# Patient Record
Sex: Female | Born: 1940
Health system: Southern US, Community
[De-identification: ages and names within clinical notes are randomized; demographics above are authoritative.]

## PROBLEM LIST (undated history)

## (undated) DIAGNOSIS — R7881 Bacteremia: Secondary | ICD-10-CM

## (undated) DIAGNOSIS — C55 Malignant neoplasm of uterus, part unspecified: Secondary | ICD-10-CM

## (undated) DIAGNOSIS — N179 Acute kidney failure, unspecified: Secondary | ICD-10-CM

## (undated) DIAGNOSIS — C541 Malignant neoplasm of endometrium: Secondary | ICD-10-CM

## (undated) DIAGNOSIS — K5900 Constipation, unspecified: Secondary | ICD-10-CM

## (undated) DIAGNOSIS — I248 Other forms of acute ischemic heart disease: Secondary | ICD-10-CM

## (undated) DIAGNOSIS — E039 Hypothyroidism, unspecified: Secondary | ICD-10-CM

## (undated) DIAGNOSIS — A499 Bacterial infection, unspecified: Secondary | ICD-10-CM

## (undated) DIAGNOSIS — M1990 Unspecified osteoarthritis, unspecified site: Secondary | ICD-10-CM

## (undated) DIAGNOSIS — I82220 Acute embolism and thrombosis of inferior vena cava: Secondary | ICD-10-CM

## (undated) DIAGNOSIS — K649 Unspecified hemorrhoids: Secondary | ICD-10-CM

## (undated) DIAGNOSIS — Z86718 Personal history of other venous thrombosis and embolism: Secondary | ICD-10-CM

## (undated) DIAGNOSIS — I10 Essential (primary) hypertension: Secondary | ICD-10-CM

## (undated) DIAGNOSIS — I5032 Chronic diastolic (congestive) heart failure: Principal | ICD-10-CM

## (undated) DIAGNOSIS — I219 Acute myocardial infarction, unspecified: Secondary | ICD-10-CM

## (undated) HISTORY — DX: Bacteremia: R78.81

## (undated) HISTORY — DX: Malignant neoplasm of endometrium: C54.1

## (undated) HISTORY — DX: Unspecified hemorrhoids: K64.9

## (undated) HISTORY — DX: Constipation, unspecified: K59.00

## (undated) HISTORY — DX: Malignant neoplasm of uterus, part unspecified: C55

## (undated) HISTORY — DX: Hypothyroidism, unspecified: E03.9

## (undated) HISTORY — DX: Acute myocardial infarction, unspecified: I21.9

## (undated) HISTORY — DX: Bacterial infection, unspecified: A49.9

## (undated) HISTORY — PX: OTHER SURGICAL HISTORY: SHX169

## (undated) HISTORY — DX: Other forms of acute ischemic heart disease: I24.8

## (undated) HISTORY — DX: Acute embolism and thrombosis of inferior vena cava: I82.220

## (undated) HISTORY — DX: Acute kidney failure, unspecified: N17.9

## (undated) HISTORY — DX: Personal history of other venous thrombosis and embolism: Z86.718

## (undated) HISTORY — DX: Unspecified osteoarthritis, unspecified site: M19.90

## (undated) HISTORY — DX: Essential (primary) hypertension: I10

## (undated) HISTORY — DX: Chronic diastolic (congestive) heart failure: I50.32

---

## 2016-03-20 DIAGNOSIS — M1990 Unspecified osteoarthritis, unspecified site: Secondary | ICD-10-CM | POA: Diagnosis not present

## 2016-03-20 DIAGNOSIS — E039 Hypothyroidism, unspecified: Secondary | ICD-10-CM | POA: Diagnosis not present

## 2016-03-20 DIAGNOSIS — Z1231 Encounter for screening mammogram for malignant neoplasm of breast: Secondary | ICD-10-CM | POA: Diagnosis not present

## 2016-04-30 DIAGNOSIS — Z79899 Other long term (current) drug therapy: Secondary | ICD-10-CM | POA: Diagnosis not present

## 2016-04-30 DIAGNOSIS — R030 Elevated blood-pressure reading, without diagnosis of hypertension: Secondary | ICD-10-CM | POA: Diagnosis not present

## 2016-04-30 DIAGNOSIS — Z1231 Encounter for screening mammogram for malignant neoplasm of breast: Secondary | ICD-10-CM | POA: Diagnosis not present

## 2016-04-30 DIAGNOSIS — Z0000 Encounter for general adult medical examination without abnormal findings: Secondary | ICD-10-CM | POA: Diagnosis not present

## 2016-04-30 DIAGNOSIS — Z1389 Encounter for screening for other disorder: Secondary | ICD-10-CM | POA: Diagnosis not present

## 2016-04-30 DIAGNOSIS — E039 Hypothyroidism, unspecified: Secondary | ICD-10-CM | POA: Diagnosis not present

## 2016-06-27 DIAGNOSIS — Z1211 Encounter for screening for malignant neoplasm of colon: Secondary | ICD-10-CM | POA: Diagnosis not present

## 2016-06-27 DIAGNOSIS — K5900 Constipation, unspecified: Secondary | ICD-10-CM | POA: Diagnosis not present

## 2016-08-05 DIAGNOSIS — D696 Thrombocytopenia, unspecified: Secondary | ICD-10-CM | POA: Diagnosis not present

## 2016-08-05 DIAGNOSIS — R809 Proteinuria, unspecified: Secondary | ICD-10-CM | POA: Diagnosis not present

## 2016-08-05 DIAGNOSIS — E039 Hypothyroidism, unspecified: Secondary | ICD-10-CM | POA: Diagnosis not present

## 2016-12-04 DIAGNOSIS — R69 Illness, unspecified: Secondary | ICD-10-CM | POA: Diagnosis not present

## 2017-05-06 DIAGNOSIS — Z1231 Encounter for screening mammogram for malignant neoplasm of breast: Secondary | ICD-10-CM | POA: Diagnosis not present

## 2017-05-06 DIAGNOSIS — Z23 Encounter for immunization: Secondary | ICD-10-CM | POA: Diagnosis not present

## 2017-05-06 DIAGNOSIS — E039 Hypothyroidism, unspecified: Secondary | ICD-10-CM | POA: Diagnosis not present

## 2017-05-06 DIAGNOSIS — I10 Essential (primary) hypertension: Secondary | ICD-10-CM | POA: Diagnosis not present

## 2017-05-06 DIAGNOSIS — Z0001 Encounter for general adult medical examination with abnormal findings: Secondary | ICD-10-CM | POA: Diagnosis not present

## 2017-05-06 DIAGNOSIS — Z1389 Encounter for screening for other disorder: Secondary | ICD-10-CM | POA: Diagnosis not present

## 2017-05-06 DIAGNOSIS — M810 Age-related osteoporosis without current pathological fracture: Secondary | ICD-10-CM | POA: Diagnosis not present

## 2017-05-08 DIAGNOSIS — Z1211 Encounter for screening for malignant neoplasm of colon: Secondary | ICD-10-CM | POA: Diagnosis not present

## 2017-06-05 DIAGNOSIS — R69 Illness, unspecified: Secondary | ICD-10-CM | POA: Diagnosis not present

## 2017-06-06 DIAGNOSIS — Z1231 Encounter for screening mammogram for malignant neoplasm of breast: Secondary | ICD-10-CM | POA: Diagnosis not present

## 2017-06-06 DIAGNOSIS — M8588 Other specified disorders of bone density and structure, other site: Secondary | ICD-10-CM | POA: Diagnosis not present

## 2017-06-06 DIAGNOSIS — M810 Age-related osteoporosis without current pathological fracture: Secondary | ICD-10-CM | POA: Diagnosis not present

## 2017-06-17 DIAGNOSIS — I10 Essential (primary) hypertension: Secondary | ICD-10-CM | POA: Diagnosis not present

## 2017-06-17 DIAGNOSIS — M810 Age-related osteoporosis without current pathological fracture: Secondary | ICD-10-CM | POA: Diagnosis not present

## 2017-06-17 DIAGNOSIS — E785 Hyperlipidemia, unspecified: Secondary | ICD-10-CM | POA: Diagnosis not present

## 2017-08-28 DIAGNOSIS — I10 Essential (primary) hypertension: Secondary | ICD-10-CM | POA: Diagnosis not present

## 2017-09-25 DIAGNOSIS — I10 Essential (primary) hypertension: Secondary | ICD-10-CM | POA: Diagnosis not present

## 2017-11-27 DIAGNOSIS — I10 Essential (primary) hypertension: Secondary | ICD-10-CM | POA: Diagnosis not present

## 2017-11-27 DIAGNOSIS — E039 Hypothyroidism, unspecified: Secondary | ICD-10-CM | POA: Diagnosis not present

## 2017-11-27 DIAGNOSIS — Z79899 Other long term (current) drug therapy: Secondary | ICD-10-CM | POA: Diagnosis not present

## 2017-11-27 DIAGNOSIS — E785 Hyperlipidemia, unspecified: Secondary | ICD-10-CM | POA: Diagnosis not present

## 2017-11-27 DIAGNOSIS — D696 Thrombocytopenia, unspecified: Secondary | ICD-10-CM | POA: Diagnosis not present

## 2017-12-10 DIAGNOSIS — R69 Illness, unspecified: Secondary | ICD-10-CM | POA: Diagnosis not present

## 2018-03-09 DIAGNOSIS — N200 Calculus of kidney: Secondary | ICD-10-CM | POA: Diagnosis not present

## 2018-03-10 DIAGNOSIS — R110 Nausea: Secondary | ICD-10-CM | POA: Diagnosis not present

## 2018-03-10 DIAGNOSIS — R1030 Lower abdominal pain, unspecified: Secondary | ICD-10-CM | POA: Diagnosis not present

## 2018-03-10 DIAGNOSIS — M25551 Pain in right hip: Secondary | ICD-10-CM | POA: Diagnosis not present

## 2018-03-11 ENCOUNTER — Encounter: Payer: Self-pay | Admitting: Cardiology

## 2018-03-11 DIAGNOSIS — N858 Other specified noninflammatory disorders of uterus: Secondary | ICD-10-CM | POA: Diagnosis not present

## 2018-03-11 DIAGNOSIS — J9601 Acute respiratory failure with hypoxia: Secondary | ICD-10-CM | POA: Diagnosis not present

## 2018-03-11 DIAGNOSIS — N390 Urinary tract infection, site not specified: Secondary | ICD-10-CM | POA: Diagnosis not present

## 2018-03-11 DIAGNOSIS — I5031 Acute diastolic (congestive) heart failure: Secondary | ICD-10-CM | POA: Diagnosis not present

## 2018-03-11 DIAGNOSIS — N950 Postmenopausal bleeding: Secondary | ICD-10-CM | POA: Diagnosis not present

## 2018-03-11 DIAGNOSIS — M4646 Discitis, unspecified, lumbar region: Secondary | ICD-10-CM | POA: Diagnosis not present

## 2018-03-11 DIAGNOSIS — R1084 Generalized abdominal pain: Secondary | ICD-10-CM | POA: Diagnosis not present

## 2018-03-11 DIAGNOSIS — M4807 Spinal stenosis, lumbosacral region: Secondary | ICD-10-CM | POA: Diagnosis not present

## 2018-03-11 DIAGNOSIS — I214 Non-ST elevation (NSTEMI) myocardial infarction: Secondary | ICD-10-CM | POA: Diagnosis not present

## 2018-03-11 DIAGNOSIS — N9489 Other specified conditions associated with female genital organs and menstrual cycle: Secondary | ICD-10-CM | POA: Diagnosis not present

## 2018-03-11 DIAGNOSIS — C541 Malignant neoplasm of endometrium: Secondary | ICD-10-CM | POA: Diagnosis not present

## 2018-03-11 DIAGNOSIS — R7881 Bacteremia: Secondary | ICD-10-CM | POA: Diagnosis not present

## 2018-03-11 DIAGNOSIS — I82220 Acute embolism and thrombosis of inferior vena cava: Secondary | ICD-10-CM | POA: Diagnosis not present

## 2018-03-11 DIAGNOSIS — R0902 Hypoxemia: Secondary | ICD-10-CM | POA: Diagnosis not present

## 2018-03-11 DIAGNOSIS — E039 Hypothyroidism, unspecified: Secondary | ICD-10-CM | POA: Diagnosis not present

## 2018-03-11 DIAGNOSIS — I10 Essential (primary) hypertension: Secondary | ICD-10-CM | POA: Diagnosis not present

## 2018-03-11 DIAGNOSIS — R109 Unspecified abdominal pain: Secondary | ICD-10-CM | POA: Diagnosis not present

## 2018-03-11 DIAGNOSIS — E871 Hypo-osmolality and hyponatremia: Secondary | ICD-10-CM | POA: Diagnosis not present

## 2018-03-11 DIAGNOSIS — M7989 Other specified soft tissue disorders: Secondary | ICD-10-CM | POA: Diagnosis not present

## 2018-03-11 DIAGNOSIS — R6510 Systemic inflammatory response syndrome (SIRS) of non-infectious origin without acute organ dysfunction: Secondary | ICD-10-CM | POA: Diagnosis not present

## 2018-03-11 DIAGNOSIS — Z01810 Encounter for preprocedural cardiovascular examination: Secondary | ICD-10-CM | POA: Diagnosis not present

## 2018-03-11 DIAGNOSIS — J9811 Atelectasis: Secondary | ICD-10-CM | POA: Diagnosis not present

## 2018-03-11 DIAGNOSIS — I110 Hypertensive heart disease with heart failure: Secondary | ICD-10-CM | POA: Diagnosis not present

## 2018-03-11 DIAGNOSIS — B9620 Unspecified Escherichia coli [E. coli] as the cause of diseases classified elsewhere: Secondary | ICD-10-CM | POA: Diagnosis not present

## 2018-03-11 DIAGNOSIS — N859 Noninflammatory disorder of uterus, unspecified: Secondary | ICD-10-CM | POA: Diagnosis not present

## 2018-03-11 DIAGNOSIS — A4102 Sepsis due to Methicillin resistant Staphylococcus aureus: Secondary | ICD-10-CM | POA: Diagnosis not present

## 2018-03-11 DIAGNOSIS — I509 Heart failure, unspecified: Secondary | ICD-10-CM | POA: Diagnosis not present

## 2018-03-12 DIAGNOSIS — I509 Heart failure, unspecified: Secondary | ICD-10-CM

## 2018-03-12 DIAGNOSIS — I82220 Acute embolism and thrombosis of inferior vena cava: Secondary | ICD-10-CM

## 2018-03-16 DIAGNOSIS — Z01810 Encounter for preprocedural cardiovascular examination: Secondary | ICD-10-CM

## 2018-03-17 DIAGNOSIS — E039 Hypothyroidism, unspecified: Secondary | ICD-10-CM

## 2018-03-17 DIAGNOSIS — R6510 Systemic inflammatory response syndrome (SIRS) of non-infectious origin without acute organ dysfunction: Secondary | ICD-10-CM

## 2018-03-17 DIAGNOSIS — M4646 Discitis, unspecified, lumbar region: Secondary | ICD-10-CM

## 2018-03-18 DIAGNOSIS — I214 Non-ST elevation (NSTEMI) myocardial infarction: Secondary | ICD-10-CM

## 2018-03-18 DIAGNOSIS — N859 Noninflammatory disorder of uterus, unspecified: Secondary | ICD-10-CM

## 2018-03-19 HISTORY — PX: ABDOMINAL HYSTERECTOMY: SHX81

## 2018-03-21 DIAGNOSIS — R279 Unspecified lack of coordination: Secondary | ICD-10-CM | POA: Diagnosis not present

## 2018-03-21 DIAGNOSIS — I82220 Acute embolism and thrombosis of inferior vena cava: Secondary | ICD-10-CM | POA: Diagnosis not present

## 2018-03-21 DIAGNOSIS — G8918 Other acute postprocedural pain: Secondary | ICD-10-CM | POA: Diagnosis not present

## 2018-03-21 DIAGNOSIS — I959 Hypotension, unspecified: Secondary | ICD-10-CM | POA: Diagnosis not present

## 2018-03-21 DIAGNOSIS — D62 Acute posthemorrhagic anemia: Secondary | ICD-10-CM | POA: Diagnosis not present

## 2018-03-21 DIAGNOSIS — R079 Chest pain, unspecified: Secondary | ICD-10-CM | POA: Diagnosis not present

## 2018-03-21 DIAGNOSIS — G062 Extradural and subdural abscess, unspecified: Secondary | ICD-10-CM | POA: Diagnosis not present

## 2018-03-21 DIAGNOSIS — M869 Osteomyelitis, unspecified: Secondary | ICD-10-CM | POA: Diagnosis not present

## 2018-03-21 DIAGNOSIS — M4646 Discitis, unspecified, lumbar region: Secondary | ICD-10-CM | POA: Diagnosis not present

## 2018-03-21 DIAGNOSIS — I5032 Chronic diastolic (congestive) heart failure: Secondary | ICD-10-CM | POA: Diagnosis not present

## 2018-03-21 DIAGNOSIS — N859 Noninflammatory disorder of uterus, unspecified: Secondary | ICD-10-CM | POA: Diagnosis not present

## 2018-03-21 DIAGNOSIS — I5030 Unspecified diastolic (congestive) heart failure: Secondary | ICD-10-CM | POA: Diagnosis not present

## 2018-03-21 DIAGNOSIS — E039 Hypothyroidism, unspecified: Secondary | ICD-10-CM | POA: Diagnosis not present

## 2018-03-21 DIAGNOSIS — B9620 Unspecified Escherichia coli [E. coli] as the cause of diseases classified elsewhere: Secondary | ICD-10-CM | POA: Diagnosis not present

## 2018-03-21 DIAGNOSIS — Z22322 Carrier or suspected carrier of Methicillin resistant Staphylococcus aureus: Secondary | ICD-10-CM | POA: Diagnosis not present

## 2018-03-21 DIAGNOSIS — G629 Polyneuropathy, unspecified: Secondary | ICD-10-CM | POA: Diagnosis not present

## 2018-03-21 DIAGNOSIS — I10 Essential (primary) hypertension: Secondary | ICD-10-CM | POA: Diagnosis not present

## 2018-03-21 DIAGNOSIS — N939 Abnormal uterine and vaginal bleeding, unspecified: Secondary | ICD-10-CM | POA: Diagnosis not present

## 2018-03-21 DIAGNOSIS — Z66 Do not resuscitate: Secondary | ICD-10-CM | POA: Diagnosis not present

## 2018-03-21 DIAGNOSIS — E878 Other disorders of electrolyte and fluid balance, not elsewhere classified: Secondary | ICD-10-CM | POA: Diagnosis not present

## 2018-03-21 DIAGNOSIS — I248 Other forms of acute ischemic heart disease: Secondary | ICD-10-CM | POA: Diagnosis not present

## 2018-03-21 DIAGNOSIS — R339 Retention of urine, unspecified: Secondary | ICD-10-CM | POA: Diagnosis not present

## 2018-03-21 DIAGNOSIS — M4626 Osteomyelitis of vertebra, lumbar region: Secondary | ICD-10-CM | POA: Diagnosis not present

## 2018-03-21 DIAGNOSIS — R9431 Abnormal electrocardiogram [ECG] [EKG]: Secondary | ICD-10-CM | POA: Diagnosis not present

## 2018-03-21 DIAGNOSIS — Z01818 Encounter for other preprocedural examination: Secondary | ICD-10-CM | POA: Diagnosis not present

## 2018-03-21 DIAGNOSIS — Z7901 Long term (current) use of anticoagulants: Secondary | ICD-10-CM | POA: Diagnosis not present

## 2018-03-21 DIAGNOSIS — G061 Intraspinal abscess and granuloma: Secondary | ICD-10-CM | POA: Diagnosis not present

## 2018-03-21 DIAGNOSIS — R7989 Other specified abnormal findings of blood chemistry: Secondary | ICD-10-CM | POA: Diagnosis not present

## 2018-03-21 DIAGNOSIS — I509 Heart failure, unspecified: Secondary | ICD-10-CM | POA: Diagnosis not present

## 2018-03-21 DIAGNOSIS — I214 Non-ST elevation (NSTEMI) myocardial infarction: Secondary | ICD-10-CM | POA: Diagnosis not present

## 2018-03-21 DIAGNOSIS — B9562 Methicillin resistant Staphylococcus aureus infection as the cause of diseases classified elsewhere: Secondary | ICD-10-CM | POA: Diagnosis not present

## 2018-03-21 DIAGNOSIS — B9629 Other Escherichia coli [E. coli] as the cause of diseases classified elsewhere: Secondary | ICD-10-CM | POA: Diagnosis not present

## 2018-03-21 DIAGNOSIS — M4640 Discitis, unspecified, site unspecified: Secondary | ICD-10-CM | POA: Diagnosis not present

## 2018-03-21 DIAGNOSIS — N390 Urinary tract infection, site not specified: Secondary | ICD-10-CM | POA: Diagnosis not present

## 2018-03-21 DIAGNOSIS — Z743 Need for continuous supervision: Secondary | ICD-10-CM | POA: Diagnosis not present

## 2018-03-21 DIAGNOSIS — I21A1 Myocardial infarction type 2: Secondary | ICD-10-CM | POA: Diagnosis not present

## 2018-03-21 DIAGNOSIS — R7881 Bacteremia: Secondary | ICD-10-CM | POA: Diagnosis not present

## 2018-03-21 DIAGNOSIS — C541 Malignant neoplasm of endometrium: Secondary | ICD-10-CM | POA: Diagnosis not present

## 2018-03-22 DIAGNOSIS — C541 Malignant neoplasm of endometrium: Secondary | ICD-10-CM

## 2018-03-22 DIAGNOSIS — B9562 Methicillin resistant Staphylococcus aureus infection as the cause of diseases classified elsewhere: Secondary | ICD-10-CM | POA: Insufficient documentation

## 2018-03-22 DIAGNOSIS — R7881 Bacteremia: Secondary | ICD-10-CM | POA: Insufficient documentation

## 2018-03-22 DIAGNOSIS — I248 Other forms of acute ischemic heart disease: Secondary | ICD-10-CM

## 2018-03-22 DIAGNOSIS — I82220 Acute embolism and thrombosis of inferior vena cava: Secondary | ICD-10-CM

## 2018-03-22 DIAGNOSIS — I2489 Other forms of acute ischemic heart disease: Secondary | ICD-10-CM

## 2018-03-22 HISTORY — DX: Malignant neoplasm of endometrium: C54.1

## 2018-03-22 HISTORY — DX: Acute embolism and thrombosis of inferior vena cava: I82.220

## 2018-03-22 HISTORY — DX: Other forms of acute ischemic heart disease: I24.8

## 2018-03-22 HISTORY — DX: Other forms of acute ischemic heart disease: I24.89

## 2018-03-22 HISTORY — DX: Methicillin resistant Staphylococcus aureus infection as the cause of diseases classified elsewhere: B95.62

## 2018-03-22 HISTORY — DX: Bacteremia: R78.81

## 2018-03-25 DIAGNOSIS — I5032 Chronic diastolic (congestive) heart failure: Secondary | ICD-10-CM | POA: Insufficient documentation

## 2018-03-25 HISTORY — DX: Chronic diastolic (congestive) heart failure: I50.32

## 2018-04-08 DIAGNOSIS — E559 Vitamin D deficiency, unspecified: Secondary | ICD-10-CM | POA: Diagnosis not present

## 2018-04-08 DIAGNOSIS — Z90710 Acquired absence of both cervix and uterus: Secondary | ICD-10-CM | POA: Diagnosis not present

## 2018-04-08 DIAGNOSIS — I10 Essential (primary) hypertension: Secondary | ICD-10-CM | POA: Diagnosis not present

## 2018-04-08 DIAGNOSIS — R918 Other nonspecific abnormal finding of lung field: Secondary | ICD-10-CM | POA: Diagnosis not present

## 2018-04-08 DIAGNOSIS — I959 Hypotension, unspecified: Secondary | ICD-10-CM | POA: Diagnosis not present

## 2018-04-08 DIAGNOSIS — L89153 Pressure ulcer of sacral region, stage 3: Secondary | ICD-10-CM | POA: Diagnosis not present

## 2018-04-08 DIAGNOSIS — D62 Acute posthemorrhagic anemia: Secondary | ICD-10-CM | POA: Diagnosis not present

## 2018-04-08 DIAGNOSIS — Z483 Aftercare following surgery for neoplasm: Secondary | ICD-10-CM | POA: Diagnosis not present

## 2018-04-08 DIAGNOSIS — M4640 Discitis, unspecified, site unspecified: Secondary | ICD-10-CM | POA: Diagnosis not present

## 2018-04-08 DIAGNOSIS — E039 Hypothyroidism, unspecified: Secondary | ICD-10-CM | POA: Diagnosis not present

## 2018-04-08 DIAGNOSIS — R7881 Bacteremia: Secondary | ICD-10-CM | POA: Diagnosis not present

## 2018-04-08 DIAGNOSIS — E119 Type 2 diabetes mellitus without complications: Secondary | ICD-10-CM | POA: Diagnosis not present

## 2018-04-08 DIAGNOSIS — E785 Hyperlipidemia, unspecified: Secondary | ICD-10-CM | POA: Diagnosis not present

## 2018-04-08 DIAGNOSIS — E860 Dehydration: Secondary | ICD-10-CM | POA: Diagnosis not present

## 2018-04-08 DIAGNOSIS — L270 Generalized skin eruption due to drugs and medicaments taken internally: Secondary | ICD-10-CM | POA: Diagnosis not present

## 2018-04-08 DIAGNOSIS — L89159 Pressure ulcer of sacral region, unspecified stage: Secondary | ICD-10-CM | POA: Diagnosis not present

## 2018-04-08 DIAGNOSIS — G629 Polyneuropathy, unspecified: Secondary | ICD-10-CM | POA: Diagnosis not present

## 2018-04-08 DIAGNOSIS — A4902 Methicillin resistant Staphylococcus aureus infection, unspecified site: Secondary | ICD-10-CM | POA: Diagnosis not present

## 2018-04-08 DIAGNOSIS — Z515 Encounter for palliative care: Secondary | ICD-10-CM | POA: Diagnosis not present

## 2018-04-08 DIAGNOSIS — M4626 Osteomyelitis of vertebra, lumbar region: Secondary | ICD-10-CM | POA: Diagnosis not present

## 2018-04-08 DIAGNOSIS — R339 Retention of urine, unspecified: Secondary | ICD-10-CM | POA: Diagnosis not present

## 2018-04-08 DIAGNOSIS — Z452 Encounter for adjustment and management of vascular access device: Secondary | ICD-10-CM | POA: Diagnosis not present

## 2018-04-08 DIAGNOSIS — A4101 Sepsis due to Methicillin susceptible Staphylococcus aureus: Secondary | ICD-10-CM | POA: Diagnosis not present

## 2018-04-08 DIAGNOSIS — I509 Heart failure, unspecified: Secondary | ICD-10-CM | POA: Diagnosis not present

## 2018-04-08 DIAGNOSIS — D649 Anemia, unspecified: Secondary | ICD-10-CM | POA: Diagnosis not present

## 2018-04-08 DIAGNOSIS — Z90722 Acquired absence of ovaries, bilateral: Secondary | ICD-10-CM | POA: Diagnosis not present

## 2018-04-08 DIAGNOSIS — M4646 Discitis, unspecified, lumbar region: Secondary | ICD-10-CM | POA: Diagnosis not present

## 2018-04-08 DIAGNOSIS — C541 Malignant neoplasm of endometrium: Secondary | ICD-10-CM | POA: Diagnosis not present

## 2018-04-08 DIAGNOSIS — K449 Diaphragmatic hernia without obstruction or gangrene: Secondary | ICD-10-CM | POA: Diagnosis not present

## 2018-04-08 DIAGNOSIS — N179 Acute kidney failure, unspecified: Secondary | ICD-10-CM | POA: Diagnosis not present

## 2018-04-08 DIAGNOSIS — I110 Hypertensive heart disease with heart failure: Secondary | ICD-10-CM | POA: Diagnosis not present

## 2018-04-08 DIAGNOSIS — Z743 Need for continuous supervision: Secondary | ICD-10-CM | POA: Diagnosis not present

## 2018-04-08 DIAGNOSIS — Z79899 Other long term (current) drug therapy: Secondary | ICD-10-CM | POA: Diagnosis not present

## 2018-04-08 DIAGNOSIS — I5032 Chronic diastolic (congestive) heart failure: Secondary | ICD-10-CM | POA: Diagnosis not present

## 2018-04-08 DIAGNOSIS — I82220 Acute embolism and thrombosis of inferior vena cava: Secondary | ICD-10-CM | POA: Diagnosis not present

## 2018-04-08 DIAGNOSIS — R109 Unspecified abdominal pain: Secondary | ICD-10-CM | POA: Diagnosis not present

## 2018-04-08 DIAGNOSIS — M869 Osteomyelitis, unspecified: Secondary | ICD-10-CM | POA: Diagnosis not present

## 2018-04-08 DIAGNOSIS — Z7901 Long term (current) use of anticoagulants: Secondary | ICD-10-CM | POA: Diagnosis not present

## 2018-04-08 DIAGNOSIS — Z960 Presence of urogenital implants: Secondary | ICD-10-CM | POA: Diagnosis not present

## 2018-04-08 DIAGNOSIS — G062 Extradural and subdural abscess, unspecified: Secondary | ICD-10-CM | POA: Diagnosis not present

## 2018-04-08 DIAGNOSIS — R279 Unspecified lack of coordination: Secondary | ICD-10-CM | POA: Diagnosis not present

## 2018-04-09 DIAGNOSIS — R7881 Bacteremia: Secondary | ICD-10-CM | POA: Diagnosis not present

## 2018-04-09 DIAGNOSIS — M4640 Discitis, unspecified, site unspecified: Secondary | ICD-10-CM | POA: Diagnosis not present

## 2018-04-09 DIAGNOSIS — I509 Heart failure, unspecified: Secondary | ICD-10-CM | POA: Diagnosis not present

## 2018-04-09 DIAGNOSIS — D62 Acute posthemorrhagic anemia: Secondary | ICD-10-CM | POA: Diagnosis not present

## 2018-04-16 DIAGNOSIS — C541 Malignant neoplasm of endometrium: Secondary | ICD-10-CM | POA: Diagnosis not present

## 2018-04-16 DIAGNOSIS — I82220 Acute embolism and thrombosis of inferior vena cava: Secondary | ICD-10-CM | POA: Diagnosis not present

## 2018-04-16 DIAGNOSIS — I509 Heart failure, unspecified: Secondary | ICD-10-CM | POA: Diagnosis not present

## 2018-04-16 DIAGNOSIS — L270 Generalized skin eruption due to drugs and medicaments taken internally: Secondary | ICD-10-CM | POA: Diagnosis not present

## 2018-04-16 DIAGNOSIS — Z483 Aftercare following surgery for neoplasm: Secondary | ICD-10-CM | POA: Diagnosis not present

## 2018-04-16 DIAGNOSIS — R7881 Bacteremia: Secondary | ICD-10-CM | POA: Diagnosis not present

## 2018-04-16 DIAGNOSIS — Z90710 Acquired absence of both cervix and uterus: Secondary | ICD-10-CM | POA: Diagnosis not present

## 2018-04-16 DIAGNOSIS — A4902 Methicillin resistant Staphylococcus aureus infection, unspecified site: Secondary | ICD-10-CM | POA: Diagnosis not present

## 2018-04-16 DIAGNOSIS — M4626 Osteomyelitis of vertebra, lumbar region: Secondary | ICD-10-CM | POA: Diagnosis not present

## 2018-04-16 DIAGNOSIS — M4640 Discitis, unspecified, site unspecified: Secondary | ICD-10-CM | POA: Diagnosis not present

## 2018-04-16 DIAGNOSIS — G062 Extradural and subdural abscess, unspecified: Secondary | ICD-10-CM | POA: Diagnosis not present

## 2018-04-16 DIAGNOSIS — R339 Retention of urine, unspecified: Secondary | ICD-10-CM | POA: Diagnosis not present

## 2018-04-16 DIAGNOSIS — Z90722 Acquired absence of ovaries, bilateral: Secondary | ICD-10-CM | POA: Diagnosis not present

## 2018-04-23 DIAGNOSIS — E039 Hypothyroidism, unspecified: Secondary | ICD-10-CM | POA: Diagnosis not present

## 2018-04-23 DIAGNOSIS — I110 Hypertensive heart disease with heart failure: Secondary | ICD-10-CM | POA: Diagnosis not present

## 2018-04-23 DIAGNOSIS — R5381 Other malaise: Secondary | ICD-10-CM | POA: Diagnosis not present

## 2018-04-23 DIAGNOSIS — G062 Extradural and subdural abscess, unspecified: Secondary | ICD-10-CM | POA: Diagnosis not present

## 2018-04-23 DIAGNOSIS — I5030 Unspecified diastolic (congestive) heart failure: Secondary | ICD-10-CM | POA: Diagnosis not present

## 2018-04-23 DIAGNOSIS — Z515 Encounter for palliative care: Secondary | ICD-10-CM | POA: Diagnosis not present

## 2018-04-23 DIAGNOSIS — R918 Other nonspecific abnormal finding of lung field: Secondary | ICD-10-CM | POA: Diagnosis not present

## 2018-04-23 DIAGNOSIS — R41841 Cognitive communication deficit: Secondary | ICD-10-CM | POA: Diagnosis not present

## 2018-04-23 DIAGNOSIS — R339 Retention of urine, unspecified: Secondary | ICD-10-CM | POA: Diagnosis not present

## 2018-04-23 DIAGNOSIS — R279 Unspecified lack of coordination: Secondary | ICD-10-CM | POA: Diagnosis not present

## 2018-04-23 DIAGNOSIS — R7881 Bacteremia: Secondary | ICD-10-CM | POA: Diagnosis not present

## 2018-04-23 DIAGNOSIS — Z7901 Long term (current) use of anticoagulants: Secondary | ICD-10-CM | POA: Diagnosis not present

## 2018-04-23 DIAGNOSIS — M4626 Osteomyelitis of vertebra, lumbar region: Secondary | ICD-10-CM | POA: Diagnosis not present

## 2018-04-23 DIAGNOSIS — I5032 Chronic diastolic (congestive) heart failure: Secondary | ICD-10-CM | POA: Diagnosis not present

## 2018-04-23 DIAGNOSIS — K5900 Constipation, unspecified: Secondary | ICD-10-CM | POA: Diagnosis not present

## 2018-04-23 DIAGNOSIS — B9562 Methicillin resistant Staphylococcus aureus infection as the cause of diseases classified elsewhere: Secondary | ICD-10-CM | POA: Diagnosis not present

## 2018-04-23 DIAGNOSIS — M4646 Discitis, unspecified, lumbar region: Secondary | ICD-10-CM | POA: Diagnosis not present

## 2018-04-23 DIAGNOSIS — M4800 Spinal stenosis, site unspecified: Secondary | ICD-10-CM | POA: Diagnosis not present

## 2018-04-23 DIAGNOSIS — E860 Dehydration: Secondary | ICD-10-CM | POA: Diagnosis not present

## 2018-04-23 DIAGNOSIS — Z960 Presence of urogenital implants: Secondary | ICD-10-CM | POA: Diagnosis not present

## 2018-04-23 DIAGNOSIS — R2681 Unsteadiness on feet: Secondary | ICD-10-CM | POA: Diagnosis not present

## 2018-04-23 DIAGNOSIS — N179 Acute kidney failure, unspecified: Secondary | ICD-10-CM | POA: Insufficient documentation

## 2018-04-23 DIAGNOSIS — Z90710 Acquired absence of both cervix and uterus: Secondary | ICD-10-CM | POA: Diagnosis not present

## 2018-04-23 DIAGNOSIS — K449 Diaphragmatic hernia without obstruction or gangrene: Secondary | ICD-10-CM | POA: Diagnosis not present

## 2018-04-23 DIAGNOSIS — I82220 Acute embolism and thrombosis of inferior vena cava: Secondary | ICD-10-CM | POA: Diagnosis not present

## 2018-04-23 DIAGNOSIS — R109 Unspecified abdominal pain: Secondary | ICD-10-CM | POA: Diagnosis not present

## 2018-04-23 DIAGNOSIS — C541 Malignant neoplasm of endometrium: Secondary | ICD-10-CM | POA: Diagnosis not present

## 2018-04-23 DIAGNOSIS — R278 Other lack of coordination: Secondary | ICD-10-CM | POA: Diagnosis not present

## 2018-04-23 DIAGNOSIS — L89159 Pressure ulcer of sacral region, unspecified stage: Secondary | ICD-10-CM | POA: Diagnosis not present

## 2018-04-23 DIAGNOSIS — Z452 Encounter for adjustment and management of vascular access device: Secondary | ICD-10-CM | POA: Diagnosis not present

## 2018-04-23 DIAGNOSIS — M6281 Muscle weakness (generalized): Secondary | ICD-10-CM | POA: Diagnosis not present

## 2018-04-23 DIAGNOSIS — L89153 Pressure ulcer of sacral region, stage 3: Secondary | ICD-10-CM | POA: Diagnosis not present

## 2018-04-23 DIAGNOSIS — Z743 Need for continuous supervision: Secondary | ICD-10-CM | POA: Diagnosis not present

## 2018-04-23 HISTORY — DX: Acute kidney failure, unspecified: N17.9

## 2018-04-24 DIAGNOSIS — M4626 Osteomyelitis of vertebra, lumbar region: Secondary | ICD-10-CM | POA: Diagnosis not present

## 2018-04-24 DIAGNOSIS — E039 Hypothyroidism, unspecified: Secondary | ICD-10-CM | POA: Diagnosis not present

## 2018-04-24 DIAGNOSIS — B9562 Methicillin resistant Staphylococcus aureus infection as the cause of diseases classified elsewhere: Secondary | ICD-10-CM | POA: Diagnosis not present

## 2018-04-24 DIAGNOSIS — Z90710 Acquired absence of both cervix and uterus: Secondary | ICD-10-CM | POA: Diagnosis not present

## 2018-04-24 DIAGNOSIS — N179 Acute kidney failure, unspecified: Secondary | ICD-10-CM | POA: Diagnosis not present

## 2018-04-24 DIAGNOSIS — Z960 Presence of urogenital implants: Secondary | ICD-10-CM | POA: Diagnosis not present

## 2018-04-24 DIAGNOSIS — I5030 Unspecified diastolic (congestive) heart failure: Secondary | ICD-10-CM | POA: Diagnosis not present

## 2018-04-24 DIAGNOSIS — I110 Hypertensive heart disease with heart failure: Secondary | ICD-10-CM | POA: Diagnosis not present

## 2018-04-24 DIAGNOSIS — E860 Dehydration: Secondary | ICD-10-CM | POA: Diagnosis not present

## 2018-04-24 DIAGNOSIS — G062 Extradural and subdural abscess, unspecified: Secondary | ICD-10-CM | POA: Diagnosis not present

## 2018-04-24 DIAGNOSIS — M4646 Discitis, unspecified, lumbar region: Secondary | ICD-10-CM | POA: Diagnosis not present

## 2018-04-24 DIAGNOSIS — I82220 Acute embolism and thrombosis of inferior vena cava: Secondary | ICD-10-CM | POA: Diagnosis not present

## 2018-04-24 DIAGNOSIS — R339 Retention of urine, unspecified: Secondary | ICD-10-CM | POA: Diagnosis not present

## 2018-04-24 DIAGNOSIS — L89159 Pressure ulcer of sacral region, unspecified stage: Secondary | ICD-10-CM | POA: Diagnosis not present

## 2018-04-24 DIAGNOSIS — Z452 Encounter for adjustment and management of vascular access device: Secondary | ICD-10-CM | POA: Diagnosis not present

## 2018-04-25 DIAGNOSIS — R7881 Bacteremia: Secondary | ICD-10-CM | POA: Diagnosis not present

## 2018-04-25 DIAGNOSIS — M4626 Osteomyelitis of vertebra, lumbar region: Secondary | ICD-10-CM | POA: Diagnosis not present

## 2018-04-27 DIAGNOSIS — N179 Acute kidney failure, unspecified: Secondary | ICD-10-CM | POA: Diagnosis not present

## 2018-04-28 DIAGNOSIS — N179 Acute kidney failure, unspecified: Secondary | ICD-10-CM | POA: Diagnosis not present

## 2018-04-29 DIAGNOSIS — N179 Acute kidney failure, unspecified: Secondary | ICD-10-CM | POA: Diagnosis not present

## 2018-05-01 DIAGNOSIS — I82220 Acute embolism and thrombosis of inferior vena cava: Secondary | ICD-10-CM | POA: Diagnosis not present

## 2018-05-01 DIAGNOSIS — E039 Hypothyroidism, unspecified: Secondary | ICD-10-CM | POA: Diagnosis not present

## 2018-05-01 DIAGNOSIS — N179 Acute kidney failure, unspecified: Secondary | ICD-10-CM | POA: Diagnosis not present

## 2018-05-01 DIAGNOSIS — Z7901 Long term (current) use of anticoagulants: Secondary | ICD-10-CM | POA: Diagnosis not present

## 2018-05-01 DIAGNOSIS — L89159 Pressure ulcer of sacral region, unspecified stage: Secondary | ICD-10-CM | POA: Diagnosis not present

## 2018-05-01 DIAGNOSIS — M4626 Osteomyelitis of vertebra, lumbar region: Secondary | ICD-10-CM | POA: Diagnosis not present

## 2018-05-02 DIAGNOSIS — Z7901 Long term (current) use of anticoagulants: Secondary | ICD-10-CM | POA: Diagnosis not present

## 2018-05-02 DIAGNOSIS — M4626 Osteomyelitis of vertebra, lumbar region: Secondary | ICD-10-CM | POA: Diagnosis not present

## 2018-05-02 DIAGNOSIS — L89159 Pressure ulcer of sacral region, unspecified stage: Secondary | ICD-10-CM | POA: Diagnosis not present

## 2018-05-02 DIAGNOSIS — I82220 Acute embolism and thrombosis of inferior vena cava: Secondary | ICD-10-CM | POA: Diagnosis not present

## 2018-05-02 DIAGNOSIS — N179 Acute kidney failure, unspecified: Secondary | ICD-10-CM | POA: Diagnosis not present

## 2018-05-02 DIAGNOSIS — E039 Hypothyroidism, unspecified: Secondary | ICD-10-CM | POA: Diagnosis not present

## 2018-05-04 DIAGNOSIS — I82220 Acute embolism and thrombosis of inferior vena cava: Secondary | ICD-10-CM | POA: Diagnosis not present

## 2018-05-04 DIAGNOSIS — E860 Dehydration: Secondary | ICD-10-CM | POA: Diagnosis not present

## 2018-05-04 DIAGNOSIS — L89159 Pressure ulcer of sacral region, unspecified stage: Secondary | ICD-10-CM | POA: Diagnosis not present

## 2018-05-04 DIAGNOSIS — N179 Acute kidney failure, unspecified: Secondary | ICD-10-CM | POA: Diagnosis not present

## 2018-05-04 DIAGNOSIS — M4626 Osteomyelitis of vertebra, lumbar region: Secondary | ICD-10-CM | POA: Diagnosis not present

## 2018-05-04 DIAGNOSIS — E039 Hypothyroidism, unspecified: Secondary | ICD-10-CM | POA: Diagnosis not present

## 2018-05-04 DIAGNOSIS — Z452 Encounter for adjustment and management of vascular access device: Secondary | ICD-10-CM | POA: Diagnosis not present

## 2018-05-04 DIAGNOSIS — Z90710 Acquired absence of both cervix and uterus: Secondary | ICD-10-CM | POA: Diagnosis not present

## 2018-05-05 DIAGNOSIS — I82220 Acute embolism and thrombosis of inferior vena cava: Secondary | ICD-10-CM | POA: Diagnosis not present

## 2018-05-05 DIAGNOSIS — M4626 Osteomyelitis of vertebra, lumbar region: Secondary | ICD-10-CM | POA: Diagnosis not present

## 2018-05-05 DIAGNOSIS — Z90710 Acquired absence of both cervix and uterus: Secondary | ICD-10-CM | POA: Diagnosis not present

## 2018-05-05 DIAGNOSIS — L89159 Pressure ulcer of sacral region, unspecified stage: Secondary | ICD-10-CM | POA: Diagnosis not present

## 2018-05-05 DIAGNOSIS — Z452 Encounter for adjustment and management of vascular access device: Secondary | ICD-10-CM | POA: Diagnosis not present

## 2018-05-05 DIAGNOSIS — E860 Dehydration: Secondary | ICD-10-CM | POA: Diagnosis not present

## 2018-05-05 DIAGNOSIS — E039 Hypothyroidism, unspecified: Secondary | ICD-10-CM | POA: Diagnosis not present

## 2018-05-05 DIAGNOSIS — N179 Acute kidney failure, unspecified: Secondary | ICD-10-CM | POA: Diagnosis not present

## 2018-05-06 DIAGNOSIS — R7881 Bacteremia: Secondary | ICD-10-CM | POA: Diagnosis not present

## 2018-05-06 DIAGNOSIS — R278 Other lack of coordination: Secondary | ICD-10-CM | POA: Diagnosis not present

## 2018-05-06 DIAGNOSIS — G061 Intraspinal abscess and granuloma: Secondary | ICD-10-CM | POA: Diagnosis not present

## 2018-05-06 DIAGNOSIS — N179 Acute kidney failure, unspecified: Secondary | ICD-10-CM | POA: Diagnosis not present

## 2018-05-06 DIAGNOSIS — R2681 Unsteadiness on feet: Secondary | ICD-10-CM | POA: Diagnosis not present

## 2018-05-06 DIAGNOSIS — Z743 Need for continuous supervision: Secondary | ICD-10-CM | POA: Diagnosis not present

## 2018-05-06 DIAGNOSIS — R279 Unspecified lack of coordination: Secondary | ICD-10-CM | POA: Diagnosis not present

## 2018-05-06 DIAGNOSIS — R339 Retention of urine, unspecified: Secondary | ICD-10-CM | POA: Diagnosis not present

## 2018-05-06 DIAGNOSIS — E039 Hypothyroidism, unspecified: Secondary | ICD-10-CM | POA: Diagnosis not present

## 2018-05-06 DIAGNOSIS — K5900 Constipation, unspecified: Secondary | ICD-10-CM | POA: Diagnosis not present

## 2018-05-06 DIAGNOSIS — I5030 Unspecified diastolic (congestive) heart failure: Secondary | ICD-10-CM | POA: Diagnosis not present

## 2018-05-06 DIAGNOSIS — I82220 Acute embolism and thrombosis of inferior vena cava: Secondary | ICD-10-CM | POA: Diagnosis not present

## 2018-05-06 DIAGNOSIS — G062 Extradural and subdural abscess, unspecified: Secondary | ICD-10-CM | POA: Diagnosis not present

## 2018-05-06 DIAGNOSIS — R41841 Cognitive communication deficit: Secondary | ICD-10-CM | POA: Diagnosis not present

## 2018-05-06 DIAGNOSIS — R5381 Other malaise: Secondary | ICD-10-CM | POA: Diagnosis not present

## 2018-05-06 DIAGNOSIS — Z9181 History of falling: Secondary | ICD-10-CM | POA: Diagnosis not present

## 2018-05-06 DIAGNOSIS — B9562 Methicillin resistant Staphylococcus aureus infection as the cause of diseases classified elsewhere: Secondary | ICD-10-CM | POA: Diagnosis not present

## 2018-05-06 DIAGNOSIS — M4626 Osteomyelitis of vertebra, lumbar region: Secondary | ICD-10-CM | POA: Diagnosis not present

## 2018-05-06 DIAGNOSIS — Z792 Long term (current) use of antibiotics: Secondary | ICD-10-CM | POA: Diagnosis not present

## 2018-05-06 DIAGNOSIS — D649 Anemia, unspecified: Secondary | ICD-10-CM | POA: Diagnosis not present

## 2018-05-06 DIAGNOSIS — M8610 Other acute osteomyelitis, unspecified site: Secondary | ICD-10-CM | POA: Diagnosis not present

## 2018-05-06 DIAGNOSIS — M6281 Muscle weakness (generalized): Secondary | ICD-10-CM | POA: Diagnosis not present

## 2018-05-06 DIAGNOSIS — C55 Malignant neoplasm of uterus, part unspecified: Secondary | ICD-10-CM | POA: Diagnosis not present

## 2018-05-06 DIAGNOSIS — Z483 Aftercare following surgery for neoplasm: Secondary | ICD-10-CM | POA: Diagnosis not present

## 2018-05-06 DIAGNOSIS — C541 Malignant neoplasm of endometrium: Secondary | ICD-10-CM | POA: Diagnosis not present

## 2018-05-06 DIAGNOSIS — Z452 Encounter for adjustment and management of vascular access device: Secondary | ICD-10-CM | POA: Diagnosis not present

## 2018-05-06 DIAGNOSIS — E875 Hyperkalemia: Secondary | ICD-10-CM | POA: Diagnosis not present

## 2018-05-06 DIAGNOSIS — L89159 Pressure ulcer of sacral region, unspecified stage: Secondary | ICD-10-CM | POA: Diagnosis not present

## 2018-05-06 DIAGNOSIS — E860 Dehydration: Secondary | ICD-10-CM | POA: Diagnosis not present

## 2018-05-06 DIAGNOSIS — M869 Osteomyelitis, unspecified: Secondary | ICD-10-CM | POA: Diagnosis not present

## 2018-05-06 DIAGNOSIS — Z90710 Acquired absence of both cervix and uterus: Secondary | ICD-10-CM | POA: Diagnosis not present

## 2018-05-06 DIAGNOSIS — M4646 Discitis, unspecified, lumbar region: Secondary | ICD-10-CM | POA: Diagnosis not present

## 2018-05-07 DIAGNOSIS — C541 Malignant neoplasm of endometrium: Secondary | ICD-10-CM | POA: Diagnosis not present

## 2018-05-07 DIAGNOSIS — N179 Acute kidney failure, unspecified: Secondary | ICD-10-CM | POA: Diagnosis not present

## 2018-05-07 DIAGNOSIS — M869 Osteomyelitis, unspecified: Secondary | ICD-10-CM | POA: Diagnosis not present

## 2018-05-07 DIAGNOSIS — I5030 Unspecified diastolic (congestive) heart failure: Secondary | ICD-10-CM | POA: Diagnosis not present

## 2018-05-08 DIAGNOSIS — N179 Acute kidney failure, unspecified: Secondary | ICD-10-CM | POA: Diagnosis not present

## 2018-05-08 DIAGNOSIS — I5030 Unspecified diastolic (congestive) heart failure: Secondary | ICD-10-CM | POA: Diagnosis not present

## 2018-05-08 DIAGNOSIS — M869 Osteomyelitis, unspecified: Secondary | ICD-10-CM | POA: Diagnosis not present

## 2018-05-08 DIAGNOSIS — C541 Malignant neoplasm of endometrium: Secondary | ICD-10-CM | POA: Diagnosis not present

## 2018-05-11 DIAGNOSIS — C541 Malignant neoplasm of endometrium: Secondary | ICD-10-CM | POA: Diagnosis not present

## 2018-05-11 DIAGNOSIS — M4646 Discitis, unspecified, lumbar region: Secondary | ICD-10-CM | POA: Diagnosis not present

## 2018-05-11 DIAGNOSIS — M869 Osteomyelitis, unspecified: Secondary | ICD-10-CM | POA: Diagnosis not present

## 2018-05-11 DIAGNOSIS — M8610 Other acute osteomyelitis, unspecified site: Secondary | ICD-10-CM | POA: Diagnosis not present

## 2018-05-11 DIAGNOSIS — G061 Intraspinal abscess and granuloma: Secondary | ICD-10-CM | POA: Diagnosis not present

## 2018-05-11 DIAGNOSIS — Z792 Long term (current) use of antibiotics: Secondary | ICD-10-CM | POA: Diagnosis not present

## 2018-05-12 DIAGNOSIS — E875 Hyperkalemia: Secondary | ICD-10-CM | POA: Diagnosis not present

## 2018-05-12 DIAGNOSIS — D649 Anemia, unspecified: Secondary | ICD-10-CM | POA: Diagnosis not present

## 2018-05-12 DIAGNOSIS — M869 Osteomyelitis, unspecified: Secondary | ICD-10-CM | POA: Diagnosis not present

## 2018-05-14 DIAGNOSIS — C541 Malignant neoplasm of endometrium: Secondary | ICD-10-CM | POA: Diagnosis not present

## 2018-05-14 DIAGNOSIS — Z90710 Acquired absence of both cervix and uterus: Secondary | ICD-10-CM | POA: Diagnosis not present

## 2018-05-14 DIAGNOSIS — Z483 Aftercare following surgery for neoplasm: Secondary | ICD-10-CM | POA: Diagnosis not present

## 2018-05-14 DIAGNOSIS — M4646 Discitis, unspecified, lumbar region: Secondary | ICD-10-CM | POA: Diagnosis not present

## 2018-05-14 DIAGNOSIS — I82220 Acute embolism and thrombosis of inferior vena cava: Secondary | ICD-10-CM | POA: Diagnosis not present

## 2018-05-14 DIAGNOSIS — G062 Extradural and subdural abscess, unspecified: Secondary | ICD-10-CM | POA: Diagnosis not present

## 2018-05-14 DIAGNOSIS — R339 Retention of urine, unspecified: Secondary | ICD-10-CM | POA: Diagnosis not present

## 2018-05-14 DIAGNOSIS — M4626 Osteomyelitis of vertebra, lumbar region: Secondary | ICD-10-CM | POA: Diagnosis not present

## 2018-05-14 DIAGNOSIS — R7881 Bacteremia: Secondary | ICD-10-CM | POA: Diagnosis not present

## 2018-05-14 DIAGNOSIS — B9562 Methicillin resistant Staphylococcus aureus infection as the cause of diseases classified elsewhere: Secondary | ICD-10-CM | POA: Diagnosis not present

## 2018-05-15 DIAGNOSIS — R339 Retention of urine, unspecified: Secondary | ICD-10-CM | POA: Diagnosis not present

## 2018-05-15 DIAGNOSIS — E875 Hyperkalemia: Secondary | ICD-10-CM | POA: Diagnosis not present

## 2018-05-15 DIAGNOSIS — M869 Osteomyelitis, unspecified: Secondary | ICD-10-CM | POA: Diagnosis not present

## 2018-05-21 DIAGNOSIS — M869 Osteomyelitis, unspecified: Secondary | ICD-10-CM | POA: Diagnosis not present

## 2018-05-21 DIAGNOSIS — K5900 Constipation, unspecified: Secondary | ICD-10-CM | POA: Diagnosis not present

## 2018-05-21 DIAGNOSIS — Z9181 History of falling: Secondary | ICD-10-CM | POA: Diagnosis not present

## 2018-05-21 DIAGNOSIS — R5381 Other malaise: Secondary | ICD-10-CM | POA: Diagnosis not present

## 2018-05-22 DIAGNOSIS — R339 Retention of urine, unspecified: Secondary | ICD-10-CM | POA: Diagnosis not present

## 2018-05-22 DIAGNOSIS — I5030 Unspecified diastolic (congestive) heart failure: Secondary | ICD-10-CM | POA: Diagnosis not present

## 2018-05-22 DIAGNOSIS — M869 Osteomyelitis, unspecified: Secondary | ICD-10-CM | POA: Diagnosis not present

## 2018-05-27 DIAGNOSIS — K1379 Other lesions of oral mucosa: Secondary | ICD-10-CM | POA: Diagnosis not present

## 2018-05-27 DIAGNOSIS — R197 Diarrhea, unspecified: Secondary | ICD-10-CM | POA: Diagnosis not present

## 2018-05-27 DIAGNOSIS — R7881 Bacteremia: Secondary | ICD-10-CM | POA: Diagnosis not present

## 2018-05-27 DIAGNOSIS — R8290 Unspecified abnormal findings in urine: Secondary | ICD-10-CM | POA: Diagnosis not present

## 2018-05-27 DIAGNOSIS — R630 Anorexia: Secondary | ICD-10-CM | POA: Diagnosis not present

## 2018-05-27 DIAGNOSIS — I82220 Acute embolism and thrombosis of inferior vena cava: Secondary | ICD-10-CM | POA: Diagnosis not present

## 2018-05-27 DIAGNOSIS — R1084 Generalized abdominal pain: Secondary | ICD-10-CM | POA: Diagnosis not present

## 2018-05-27 DIAGNOSIS — I252 Old myocardial infarction: Secondary | ICD-10-CM | POA: Diagnosis not present

## 2018-05-27 DIAGNOSIS — C541 Malignant neoplasm of endometrium: Secondary | ICD-10-CM | POA: Diagnosis not present

## 2018-05-27 DIAGNOSIS — R159 Full incontinence of feces: Secondary | ICD-10-CM | POA: Diagnosis not present

## 2018-05-28 DIAGNOSIS — E039 Hypothyroidism, unspecified: Secondary | ICD-10-CM | POA: Diagnosis not present

## 2018-05-28 DIAGNOSIS — L89313 Pressure ulcer of right buttock, stage 3: Secondary | ICD-10-CM | POA: Diagnosis not present

## 2018-05-28 DIAGNOSIS — L89322 Pressure ulcer of left buttock, stage 2: Secondary | ICD-10-CM | POA: Diagnosis not present

## 2018-05-28 DIAGNOSIS — Z792 Long term (current) use of antibiotics: Secondary | ICD-10-CM | POA: Diagnosis not present

## 2018-05-28 DIAGNOSIS — I82220 Acute embolism and thrombosis of inferior vena cava: Secondary | ICD-10-CM | POA: Diagnosis not present

## 2018-05-28 DIAGNOSIS — M4626 Osteomyelitis of vertebra, lumbar region: Secondary | ICD-10-CM | POA: Diagnosis not present

## 2018-05-28 DIAGNOSIS — I5032 Chronic diastolic (congestive) heart failure: Secondary | ICD-10-CM | POA: Diagnosis not present

## 2018-05-28 DIAGNOSIS — C541 Malignant neoplasm of endometrium: Secondary | ICD-10-CM | POA: Diagnosis not present

## 2018-05-28 DIAGNOSIS — I110 Hypertensive heart disease with heart failure: Secondary | ICD-10-CM | POA: Diagnosis not present

## 2018-05-28 DIAGNOSIS — M6281 Muscle weakness (generalized): Secondary | ICD-10-CM | POA: Diagnosis not present

## 2018-05-28 DIAGNOSIS — Z7901 Long term (current) use of anticoagulants: Secondary | ICD-10-CM | POA: Diagnosis not present

## 2018-05-29 DIAGNOSIS — Z792 Long term (current) use of antibiotics: Secondary | ICD-10-CM | POA: Diagnosis not present

## 2018-05-29 DIAGNOSIS — L89313 Pressure ulcer of right buttock, stage 3: Secondary | ICD-10-CM | POA: Diagnosis not present

## 2018-05-29 DIAGNOSIS — Z7901 Long term (current) use of anticoagulants: Secondary | ICD-10-CM | POA: Diagnosis not present

## 2018-05-29 DIAGNOSIS — I5032 Chronic diastolic (congestive) heart failure: Secondary | ICD-10-CM | POA: Diagnosis not present

## 2018-05-29 DIAGNOSIS — I110 Hypertensive heart disease with heart failure: Secondary | ICD-10-CM | POA: Diagnosis not present

## 2018-05-29 DIAGNOSIS — L89322 Pressure ulcer of left buttock, stage 2: Secondary | ICD-10-CM | POA: Diagnosis not present

## 2018-05-29 DIAGNOSIS — M4626 Osteomyelitis of vertebra, lumbar region: Secondary | ICD-10-CM | POA: Diagnosis not present

## 2018-05-29 DIAGNOSIS — C541 Malignant neoplasm of endometrium: Secondary | ICD-10-CM | POA: Diagnosis not present

## 2018-05-29 DIAGNOSIS — I82220 Acute embolism and thrombosis of inferior vena cava: Secondary | ICD-10-CM | POA: Diagnosis not present

## 2018-05-29 DIAGNOSIS — E039 Hypothyroidism, unspecified: Secondary | ICD-10-CM | POA: Diagnosis not present

## 2018-06-02 DIAGNOSIS — Z792 Long term (current) use of antibiotics: Secondary | ICD-10-CM | POA: Diagnosis not present

## 2018-06-02 DIAGNOSIS — C541 Malignant neoplasm of endometrium: Secondary | ICD-10-CM | POA: Diagnosis not present

## 2018-06-02 DIAGNOSIS — I5032 Chronic diastolic (congestive) heart failure: Secondary | ICD-10-CM | POA: Diagnosis not present

## 2018-06-02 DIAGNOSIS — E039 Hypothyroidism, unspecified: Secondary | ICD-10-CM | POA: Diagnosis not present

## 2018-06-02 DIAGNOSIS — M4626 Osteomyelitis of vertebra, lumbar region: Secondary | ICD-10-CM | POA: Diagnosis not present

## 2018-06-02 DIAGNOSIS — I110 Hypertensive heart disease with heart failure: Secondary | ICD-10-CM | POA: Diagnosis not present

## 2018-06-02 DIAGNOSIS — I82220 Acute embolism and thrombosis of inferior vena cava: Secondary | ICD-10-CM | POA: Diagnosis not present

## 2018-06-02 DIAGNOSIS — Z7901 Long term (current) use of anticoagulants: Secondary | ICD-10-CM | POA: Diagnosis not present

## 2018-06-02 DIAGNOSIS — L89313 Pressure ulcer of right buttock, stage 3: Secondary | ICD-10-CM | POA: Diagnosis not present

## 2018-06-02 DIAGNOSIS — L89322 Pressure ulcer of left buttock, stage 2: Secondary | ICD-10-CM | POA: Diagnosis not present

## 2018-06-04 DIAGNOSIS — Z7901 Long term (current) use of anticoagulants: Secondary | ICD-10-CM | POA: Diagnosis not present

## 2018-06-04 DIAGNOSIS — E039 Hypothyroidism, unspecified: Secondary | ICD-10-CM | POA: Diagnosis not present

## 2018-06-04 DIAGNOSIS — I110 Hypertensive heart disease with heart failure: Secondary | ICD-10-CM | POA: Diagnosis not present

## 2018-06-04 DIAGNOSIS — I5032 Chronic diastolic (congestive) heart failure: Secondary | ICD-10-CM | POA: Diagnosis not present

## 2018-06-04 DIAGNOSIS — R8290 Unspecified abnormal findings in urine: Secondary | ICD-10-CM | POA: Diagnosis not present

## 2018-06-04 DIAGNOSIS — M4626 Osteomyelitis of vertebra, lumbar region: Secondary | ICD-10-CM | POA: Diagnosis not present

## 2018-06-04 DIAGNOSIS — C541 Malignant neoplasm of endometrium: Secondary | ICD-10-CM | POA: Diagnosis not present

## 2018-06-04 DIAGNOSIS — R197 Diarrhea, unspecified: Secondary | ICD-10-CM | POA: Diagnosis not present

## 2018-06-04 DIAGNOSIS — L89322 Pressure ulcer of left buttock, stage 2: Secondary | ICD-10-CM | POA: Diagnosis not present

## 2018-06-04 DIAGNOSIS — I82220 Acute embolism and thrombosis of inferior vena cava: Secondary | ICD-10-CM | POA: Diagnosis not present

## 2018-06-04 DIAGNOSIS — R159 Full incontinence of feces: Secondary | ICD-10-CM | POA: Diagnosis not present

## 2018-06-04 DIAGNOSIS — Z792 Long term (current) use of antibiotics: Secondary | ICD-10-CM | POA: Diagnosis not present

## 2018-06-04 DIAGNOSIS — L89313 Pressure ulcer of right buttock, stage 3: Secondary | ICD-10-CM | POA: Diagnosis not present

## 2018-06-05 DIAGNOSIS — Z7901 Long term (current) use of anticoagulants: Secondary | ICD-10-CM | POA: Diagnosis not present

## 2018-06-05 DIAGNOSIS — E039 Hypothyroidism, unspecified: Secondary | ICD-10-CM | POA: Diagnosis not present

## 2018-06-05 DIAGNOSIS — Z792 Long term (current) use of antibiotics: Secondary | ICD-10-CM | POA: Diagnosis not present

## 2018-06-05 DIAGNOSIS — L89313 Pressure ulcer of right buttock, stage 3: Secondary | ICD-10-CM | POA: Diagnosis not present

## 2018-06-05 DIAGNOSIS — I5032 Chronic diastolic (congestive) heart failure: Secondary | ICD-10-CM | POA: Diagnosis not present

## 2018-06-05 DIAGNOSIS — I82220 Acute embolism and thrombosis of inferior vena cava: Secondary | ICD-10-CM | POA: Diagnosis not present

## 2018-06-05 DIAGNOSIS — C541 Malignant neoplasm of endometrium: Secondary | ICD-10-CM | POA: Diagnosis not present

## 2018-06-05 DIAGNOSIS — I110 Hypertensive heart disease with heart failure: Secondary | ICD-10-CM | POA: Diagnosis not present

## 2018-06-05 DIAGNOSIS — L89322 Pressure ulcer of left buttock, stage 2: Secondary | ICD-10-CM | POA: Diagnosis not present

## 2018-06-05 DIAGNOSIS — M4626 Osteomyelitis of vertebra, lumbar region: Secondary | ICD-10-CM | POA: Diagnosis not present

## 2018-06-08 DIAGNOSIS — M4626 Osteomyelitis of vertebra, lumbar region: Secondary | ICD-10-CM | POA: Diagnosis not present

## 2018-06-08 DIAGNOSIS — L89313 Pressure ulcer of right buttock, stage 3: Secondary | ICD-10-CM | POA: Diagnosis not present

## 2018-06-08 DIAGNOSIS — I5032 Chronic diastolic (congestive) heart failure: Secondary | ICD-10-CM | POA: Diagnosis not present

## 2018-06-08 DIAGNOSIS — Z7901 Long term (current) use of anticoagulants: Secondary | ICD-10-CM | POA: Diagnosis not present

## 2018-06-08 DIAGNOSIS — C541 Malignant neoplasm of endometrium: Secondary | ICD-10-CM | POA: Diagnosis not present

## 2018-06-08 DIAGNOSIS — I82220 Acute embolism and thrombosis of inferior vena cava: Secondary | ICD-10-CM | POA: Diagnosis not present

## 2018-06-08 DIAGNOSIS — I110 Hypertensive heart disease with heart failure: Secondary | ICD-10-CM | POA: Diagnosis not present

## 2018-06-08 DIAGNOSIS — Z792 Long term (current) use of antibiotics: Secondary | ICD-10-CM | POA: Diagnosis not present

## 2018-06-08 DIAGNOSIS — L89322 Pressure ulcer of left buttock, stage 2: Secondary | ICD-10-CM | POA: Diagnosis not present

## 2018-06-08 DIAGNOSIS — E039 Hypothyroidism, unspecified: Secondary | ICD-10-CM | POA: Diagnosis not present

## 2018-06-11 DIAGNOSIS — C541 Malignant neoplasm of endometrium: Secondary | ICD-10-CM | POA: Diagnosis not present

## 2018-06-11 DIAGNOSIS — I110 Hypertensive heart disease with heart failure: Secondary | ICD-10-CM | POA: Diagnosis not present

## 2018-06-11 DIAGNOSIS — E039 Hypothyroidism, unspecified: Secondary | ICD-10-CM | POA: Diagnosis not present

## 2018-06-11 DIAGNOSIS — Z792 Long term (current) use of antibiotics: Secondary | ICD-10-CM | POA: Diagnosis not present

## 2018-06-11 DIAGNOSIS — M4626 Osteomyelitis of vertebra, lumbar region: Secondary | ICD-10-CM | POA: Diagnosis not present

## 2018-06-11 DIAGNOSIS — L89322 Pressure ulcer of left buttock, stage 2: Secondary | ICD-10-CM | POA: Diagnosis not present

## 2018-06-11 DIAGNOSIS — I5032 Chronic diastolic (congestive) heart failure: Secondary | ICD-10-CM | POA: Diagnosis not present

## 2018-06-11 DIAGNOSIS — Z7901 Long term (current) use of anticoagulants: Secondary | ICD-10-CM | POA: Diagnosis not present

## 2018-06-11 DIAGNOSIS — L89313 Pressure ulcer of right buttock, stage 3: Secondary | ICD-10-CM | POA: Diagnosis not present

## 2018-06-11 DIAGNOSIS — I82220 Acute embolism and thrombosis of inferior vena cava: Secondary | ICD-10-CM | POA: Diagnosis not present

## 2018-06-12 DIAGNOSIS — Z792 Long term (current) use of antibiotics: Secondary | ICD-10-CM | POA: Diagnosis not present

## 2018-06-12 DIAGNOSIS — C541 Malignant neoplasm of endometrium: Secondary | ICD-10-CM | POA: Diagnosis not present

## 2018-06-12 DIAGNOSIS — L89313 Pressure ulcer of right buttock, stage 3: Secondary | ICD-10-CM | POA: Diagnosis not present

## 2018-06-12 DIAGNOSIS — L89322 Pressure ulcer of left buttock, stage 2: Secondary | ICD-10-CM | POA: Diagnosis not present

## 2018-06-12 DIAGNOSIS — I82220 Acute embolism and thrombosis of inferior vena cava: Secondary | ICD-10-CM | POA: Diagnosis not present

## 2018-06-12 DIAGNOSIS — R197 Diarrhea, unspecified: Secondary | ICD-10-CM | POA: Diagnosis not present

## 2018-06-12 DIAGNOSIS — E039 Hypothyroidism, unspecified: Secondary | ICD-10-CM | POA: Diagnosis not present

## 2018-06-12 DIAGNOSIS — Z7901 Long term (current) use of anticoagulants: Secondary | ICD-10-CM | POA: Diagnosis not present

## 2018-06-12 DIAGNOSIS — I110 Hypertensive heart disease with heart failure: Secondary | ICD-10-CM | POA: Diagnosis not present

## 2018-06-12 DIAGNOSIS — M4626 Osteomyelitis of vertebra, lumbar region: Secondary | ICD-10-CM | POA: Diagnosis not present

## 2018-06-12 DIAGNOSIS — I5032 Chronic diastolic (congestive) heart failure: Secondary | ICD-10-CM | POA: Diagnosis not present

## 2018-06-12 DIAGNOSIS — K649 Unspecified hemorrhoids: Secondary | ICD-10-CM | POA: Diagnosis not present

## 2018-06-12 DIAGNOSIS — R69 Illness, unspecified: Secondary | ICD-10-CM | POA: Diagnosis not present

## 2018-06-15 DIAGNOSIS — I110 Hypertensive heart disease with heart failure: Secondary | ICD-10-CM | POA: Diagnosis not present

## 2018-06-15 DIAGNOSIS — R7989 Other specified abnormal findings of blood chemistry: Secondary | ICD-10-CM | POA: Diagnosis not present

## 2018-06-15 DIAGNOSIS — Z792 Long term (current) use of antibiotics: Secondary | ICD-10-CM | POA: Diagnosis not present

## 2018-06-15 DIAGNOSIS — I5032 Chronic diastolic (congestive) heart failure: Secondary | ICD-10-CM | POA: Diagnosis not present

## 2018-06-15 DIAGNOSIS — I82220 Acute embolism and thrombosis of inferior vena cava: Secondary | ICD-10-CM | POA: Diagnosis not present

## 2018-06-15 DIAGNOSIS — E039 Hypothyroidism, unspecified: Secondary | ICD-10-CM | POA: Diagnosis not present

## 2018-06-15 DIAGNOSIS — L89322 Pressure ulcer of left buttock, stage 2: Secondary | ICD-10-CM | POA: Diagnosis not present

## 2018-06-15 DIAGNOSIS — Z7901 Long term (current) use of anticoagulants: Secondary | ICD-10-CM | POA: Diagnosis not present

## 2018-06-15 DIAGNOSIS — L89313 Pressure ulcer of right buttock, stage 3: Secondary | ICD-10-CM | POA: Diagnosis not present

## 2018-06-15 DIAGNOSIS — C541 Malignant neoplasm of endometrium: Secondary | ICD-10-CM | POA: Diagnosis not present

## 2018-06-15 DIAGNOSIS — M4626 Osteomyelitis of vertebra, lumbar region: Secondary | ICD-10-CM | POA: Diagnosis not present

## 2018-06-16 DIAGNOSIS — C55 Malignant neoplasm of uterus, part unspecified: Secondary | ICD-10-CM | POA: Diagnosis not present

## 2018-06-16 DIAGNOSIS — R600 Localized edema: Secondary | ICD-10-CM | POA: Diagnosis not present

## 2018-06-16 DIAGNOSIS — C542 Malignant neoplasm of myometrium: Secondary | ICD-10-CM | POA: Diagnosis not present

## 2018-06-16 DIAGNOSIS — K6289 Other specified diseases of anus and rectum: Secondary | ICD-10-CM | POA: Diagnosis not present

## 2018-06-16 DIAGNOSIS — Z7901 Long term (current) use of anticoagulants: Secondary | ICD-10-CM | POA: Diagnosis not present

## 2018-06-16 DIAGNOSIS — E46 Unspecified protein-calorie malnutrition: Secondary | ICD-10-CM | POA: Diagnosis not present

## 2018-06-16 DIAGNOSIS — R978 Other abnormal tumor markers: Secondary | ICD-10-CM | POA: Diagnosis not present

## 2018-06-16 DIAGNOSIS — R300 Dysuria: Secondary | ICD-10-CM | POA: Diagnosis not present

## 2018-06-16 DIAGNOSIS — I252 Old myocardial infarction: Secondary | ICD-10-CM | POA: Diagnosis not present

## 2018-06-16 DIAGNOSIS — R197 Diarrhea, unspecified: Secondary | ICD-10-CM | POA: Diagnosis not present

## 2018-06-16 DIAGNOSIS — D649 Anemia, unspecified: Secondary | ICD-10-CM | POA: Diagnosis not present

## 2018-06-16 DIAGNOSIS — B370 Candidal stomatitis: Secondary | ICD-10-CM | POA: Diagnosis not present

## 2018-06-16 DIAGNOSIS — M4646 Discitis, unspecified, lumbar region: Secondary | ICD-10-CM | POA: Diagnosis not present

## 2018-06-16 DIAGNOSIS — I82220 Acute embolism and thrombosis of inferior vena cava: Secondary | ICD-10-CM | POA: Diagnosis not present

## 2018-06-18 DIAGNOSIS — L89322 Pressure ulcer of left buttock, stage 2: Secondary | ICD-10-CM | POA: Diagnosis not present

## 2018-06-18 DIAGNOSIS — Z7901 Long term (current) use of anticoagulants: Secondary | ICD-10-CM | POA: Diagnosis not present

## 2018-06-18 DIAGNOSIS — I110 Hypertensive heart disease with heart failure: Secondary | ICD-10-CM | POA: Diagnosis not present

## 2018-06-18 DIAGNOSIS — C541 Malignant neoplasm of endometrium: Secondary | ICD-10-CM | POA: Diagnosis not present

## 2018-06-18 DIAGNOSIS — I82220 Acute embolism and thrombosis of inferior vena cava: Secondary | ICD-10-CM | POA: Diagnosis not present

## 2018-06-18 DIAGNOSIS — Z792 Long term (current) use of antibiotics: Secondary | ICD-10-CM | POA: Diagnosis not present

## 2018-06-18 DIAGNOSIS — M4626 Osteomyelitis of vertebra, lumbar region: Secondary | ICD-10-CM | POA: Diagnosis not present

## 2018-06-18 DIAGNOSIS — I5032 Chronic diastolic (congestive) heart failure: Secondary | ICD-10-CM | POA: Diagnosis not present

## 2018-06-18 DIAGNOSIS — L89313 Pressure ulcer of right buttock, stage 3: Secondary | ICD-10-CM | POA: Diagnosis not present

## 2018-06-18 DIAGNOSIS — E039 Hypothyroidism, unspecified: Secondary | ICD-10-CM | POA: Diagnosis not present

## 2018-06-23 DIAGNOSIS — Z792 Long term (current) use of antibiotics: Secondary | ICD-10-CM | POA: Diagnosis not present

## 2018-06-23 DIAGNOSIS — Z7901 Long term (current) use of anticoagulants: Secondary | ICD-10-CM | POA: Diagnosis not present

## 2018-06-23 DIAGNOSIS — L89313 Pressure ulcer of right buttock, stage 3: Secondary | ICD-10-CM | POA: Diagnosis not present

## 2018-06-23 DIAGNOSIS — M4626 Osteomyelitis of vertebra, lumbar region: Secondary | ICD-10-CM | POA: Diagnosis not present

## 2018-06-23 DIAGNOSIS — I5032 Chronic diastolic (congestive) heart failure: Secondary | ICD-10-CM | POA: Diagnosis not present

## 2018-06-23 DIAGNOSIS — I110 Hypertensive heart disease with heart failure: Secondary | ICD-10-CM | POA: Diagnosis not present

## 2018-06-23 DIAGNOSIS — C541 Malignant neoplasm of endometrium: Secondary | ICD-10-CM | POA: Diagnosis not present

## 2018-06-23 DIAGNOSIS — E039 Hypothyroidism, unspecified: Secondary | ICD-10-CM | POA: Diagnosis not present

## 2018-06-23 DIAGNOSIS — L89322 Pressure ulcer of left buttock, stage 2: Secondary | ICD-10-CM | POA: Diagnosis not present

## 2018-06-23 DIAGNOSIS — I82220 Acute embolism and thrombosis of inferior vena cava: Secondary | ICD-10-CM | POA: Diagnosis not present

## 2018-06-25 DIAGNOSIS — Z792 Long term (current) use of antibiotics: Secondary | ICD-10-CM | POA: Diagnosis not present

## 2018-06-25 DIAGNOSIS — C541 Malignant neoplasm of endometrium: Secondary | ICD-10-CM | POA: Diagnosis not present

## 2018-06-25 DIAGNOSIS — M4626 Osteomyelitis of vertebra, lumbar region: Secondary | ICD-10-CM | POA: Diagnosis not present

## 2018-06-25 DIAGNOSIS — I5032 Chronic diastolic (congestive) heart failure: Secondary | ICD-10-CM | POA: Diagnosis not present

## 2018-06-25 DIAGNOSIS — Z7901 Long term (current) use of anticoagulants: Secondary | ICD-10-CM | POA: Diagnosis not present

## 2018-06-25 DIAGNOSIS — E039 Hypothyroidism, unspecified: Secondary | ICD-10-CM | POA: Diagnosis not present

## 2018-06-25 DIAGNOSIS — I110 Hypertensive heart disease with heart failure: Secondary | ICD-10-CM | POA: Diagnosis not present

## 2018-06-25 DIAGNOSIS — L89313 Pressure ulcer of right buttock, stage 3: Secondary | ICD-10-CM | POA: Diagnosis not present

## 2018-06-25 DIAGNOSIS — I82220 Acute embolism and thrombosis of inferior vena cava: Secondary | ICD-10-CM | POA: Diagnosis not present

## 2018-06-25 DIAGNOSIS — L89322 Pressure ulcer of left buttock, stage 2: Secondary | ICD-10-CM | POA: Diagnosis not present

## 2018-06-29 DIAGNOSIS — C55 Malignant neoplasm of uterus, part unspecified: Secondary | ICD-10-CM

## 2018-06-29 DIAGNOSIS — Z515 Encounter for palliative care: Secondary | ICD-10-CM | POA: Diagnosis not present

## 2018-06-29 DIAGNOSIS — D649 Anemia, unspecified: Secondary | ICD-10-CM

## 2018-06-29 DIAGNOSIS — R609 Edema, unspecified: Secondary | ICD-10-CM

## 2018-06-29 DIAGNOSIS — E039 Hypothyroidism, unspecified: Secondary | ICD-10-CM | POA: Diagnosis not present

## 2018-06-29 DIAGNOSIS — I82220 Acute embolism and thrombosis of inferior vena cava: Secondary | ICD-10-CM | POA: Diagnosis not present

## 2018-06-29 DIAGNOSIS — Z87440 Personal history of urinary (tract) infections: Secondary | ICD-10-CM

## 2018-06-29 DIAGNOSIS — N390 Urinary tract infection, site not specified: Secondary | ICD-10-CM | POA: Diagnosis not present

## 2018-06-29 DIAGNOSIS — R300 Dysuria: Secondary | ICD-10-CM

## 2018-06-29 DIAGNOSIS — R197 Diarrhea, unspecified: Secondary | ICD-10-CM

## 2018-06-29 DIAGNOSIS — E46 Unspecified protein-calorie malnutrition: Secondary | ICD-10-CM | POA: Diagnosis not present

## 2018-06-29 DIAGNOSIS — Z7901 Long term (current) use of anticoagulants: Secondary | ICD-10-CM

## 2018-06-29 DIAGNOSIS — E876 Hypokalemia: Secondary | ICD-10-CM

## 2018-06-30 DIAGNOSIS — L89322 Pressure ulcer of left buttock, stage 2: Secondary | ICD-10-CM | POA: Diagnosis not present

## 2018-06-30 DIAGNOSIS — L89313 Pressure ulcer of right buttock, stage 3: Secondary | ICD-10-CM | POA: Diagnosis not present

## 2018-06-30 DIAGNOSIS — C541 Malignant neoplasm of endometrium: Secondary | ICD-10-CM | POA: Diagnosis not present

## 2018-06-30 DIAGNOSIS — E039 Hypothyroidism, unspecified: Secondary | ICD-10-CM | POA: Diagnosis not present

## 2018-06-30 DIAGNOSIS — R630 Anorexia: Secondary | ICD-10-CM | POA: Diagnosis not present

## 2018-06-30 DIAGNOSIS — I110 Hypertensive heart disease with heart failure: Secondary | ICD-10-CM | POA: Diagnosis not present

## 2018-06-30 DIAGNOSIS — I5032 Chronic diastolic (congestive) heart failure: Secondary | ICD-10-CM | POA: Diagnosis not present

## 2018-06-30 DIAGNOSIS — Z792 Long term (current) use of antibiotics: Secondary | ICD-10-CM | POA: Diagnosis not present

## 2018-06-30 DIAGNOSIS — I82220 Acute embolism and thrombosis of inferior vena cava: Secondary | ICD-10-CM | POA: Diagnosis not present

## 2018-06-30 DIAGNOSIS — Z7901 Long term (current) use of anticoagulants: Secondary | ICD-10-CM | POA: Diagnosis not present

## 2018-06-30 DIAGNOSIS — R609 Edema, unspecified: Secondary | ICD-10-CM | POA: Diagnosis not present

## 2018-06-30 DIAGNOSIS — M4626 Osteomyelitis of vertebra, lumbar region: Secondary | ICD-10-CM | POA: Diagnosis not present

## 2018-06-30 DIAGNOSIS — I509 Heart failure, unspecified: Secondary | ICD-10-CM | POA: Diagnosis not present

## 2018-07-01 ENCOUNTER — Encounter: Payer: Self-pay | Admitting: Gastroenterology

## 2018-07-01 ENCOUNTER — Encounter: Payer: Self-pay | Admitting: Cardiology

## 2018-07-01 DIAGNOSIS — R600 Localized edema: Secondary | ICD-10-CM | POA: Diagnosis not present

## 2018-07-02 DIAGNOSIS — L89313 Pressure ulcer of right buttock, stage 3: Secondary | ICD-10-CM | POA: Diagnosis not present

## 2018-07-02 DIAGNOSIS — Z792 Long term (current) use of antibiotics: Secondary | ICD-10-CM | POA: Diagnosis not present

## 2018-07-02 DIAGNOSIS — L89322 Pressure ulcer of left buttock, stage 2: Secondary | ICD-10-CM | POA: Diagnosis not present

## 2018-07-02 DIAGNOSIS — M4626 Osteomyelitis of vertebra, lumbar region: Secondary | ICD-10-CM | POA: Diagnosis not present

## 2018-07-02 DIAGNOSIS — I5032 Chronic diastolic (congestive) heart failure: Secondary | ICD-10-CM | POA: Diagnosis not present

## 2018-07-02 DIAGNOSIS — E039 Hypothyroidism, unspecified: Secondary | ICD-10-CM | POA: Diagnosis not present

## 2018-07-02 DIAGNOSIS — I82220 Acute embolism and thrombosis of inferior vena cava: Secondary | ICD-10-CM | POA: Diagnosis not present

## 2018-07-02 DIAGNOSIS — Z7901 Long term (current) use of anticoagulants: Secondary | ICD-10-CM | POA: Diagnosis not present

## 2018-07-02 DIAGNOSIS — I110 Hypertensive heart disease with heart failure: Secondary | ICD-10-CM | POA: Diagnosis not present

## 2018-07-02 DIAGNOSIS — C541 Malignant neoplasm of endometrium: Secondary | ICD-10-CM | POA: Diagnosis not present

## 2018-07-07 DIAGNOSIS — Z792 Long term (current) use of antibiotics: Secondary | ICD-10-CM | POA: Diagnosis not present

## 2018-07-07 DIAGNOSIS — E039 Hypothyroidism, unspecified: Secondary | ICD-10-CM | POA: Diagnosis not present

## 2018-07-07 DIAGNOSIS — L89313 Pressure ulcer of right buttock, stage 3: Secondary | ICD-10-CM | POA: Diagnosis not present

## 2018-07-07 DIAGNOSIS — I82220 Acute embolism and thrombosis of inferior vena cava: Secondary | ICD-10-CM | POA: Diagnosis not present

## 2018-07-07 DIAGNOSIS — I110 Hypertensive heart disease with heart failure: Secondary | ICD-10-CM | POA: Diagnosis not present

## 2018-07-07 DIAGNOSIS — C541 Malignant neoplasm of endometrium: Secondary | ICD-10-CM | POA: Diagnosis not present

## 2018-07-07 DIAGNOSIS — Z7901 Long term (current) use of anticoagulants: Secondary | ICD-10-CM | POA: Diagnosis not present

## 2018-07-07 DIAGNOSIS — M4626 Osteomyelitis of vertebra, lumbar region: Secondary | ICD-10-CM | POA: Diagnosis not present

## 2018-07-07 DIAGNOSIS — L89322 Pressure ulcer of left buttock, stage 2: Secondary | ICD-10-CM | POA: Diagnosis not present

## 2018-07-07 DIAGNOSIS — I5032 Chronic diastolic (congestive) heart failure: Secondary | ICD-10-CM | POA: Diagnosis not present

## 2018-07-08 ENCOUNTER — Encounter: Payer: Self-pay | Admitting: Gastroenterology

## 2018-07-09 DIAGNOSIS — M4626 Osteomyelitis of vertebra, lumbar region: Secondary | ICD-10-CM | POA: Diagnosis not present

## 2018-07-09 DIAGNOSIS — I82220 Acute embolism and thrombosis of inferior vena cava: Secondary | ICD-10-CM | POA: Diagnosis not present

## 2018-07-09 DIAGNOSIS — L89313 Pressure ulcer of right buttock, stage 3: Secondary | ICD-10-CM | POA: Diagnosis not present

## 2018-07-09 DIAGNOSIS — C541 Malignant neoplasm of endometrium: Secondary | ICD-10-CM | POA: Diagnosis not present

## 2018-07-09 DIAGNOSIS — Z792 Long term (current) use of antibiotics: Secondary | ICD-10-CM | POA: Diagnosis not present

## 2018-07-09 DIAGNOSIS — L89322 Pressure ulcer of left buttock, stage 2: Secondary | ICD-10-CM | POA: Diagnosis not present

## 2018-07-09 DIAGNOSIS — I5032 Chronic diastolic (congestive) heart failure: Secondary | ICD-10-CM | POA: Diagnosis not present

## 2018-07-09 DIAGNOSIS — Z7901 Long term (current) use of anticoagulants: Secondary | ICD-10-CM | POA: Diagnosis not present

## 2018-07-09 DIAGNOSIS — I110 Hypertensive heart disease with heart failure: Secondary | ICD-10-CM | POA: Diagnosis not present

## 2018-07-09 DIAGNOSIS — E039 Hypothyroidism, unspecified: Secondary | ICD-10-CM | POA: Diagnosis not present

## 2018-07-13 ENCOUNTER — Encounter: Payer: Self-pay | Admitting: Gastroenterology

## 2018-07-13 ENCOUNTER — Ambulatory Visit: Payer: Medicare HMO | Admitting: Gastroenterology

## 2018-07-13 VITALS — BP 126/64 | HR 70 | Ht 60.0 in | Wt 131.5 lb

## 2018-07-13 DIAGNOSIS — R197 Diarrhea, unspecified: Secondary | ICD-10-CM

## 2018-07-13 MED ORDER — DIPHENOXYLATE-ATROPINE 2.5-0.025 MG PO TABS: 1.0000 | ORAL_TABLET | Freq: Three times a day (TID) | ORAL | 3 refills | Status: DC

## 2018-07-13 NOTE — Patient Instructions (Addendum)
If you are age 78 or older, your body mass index should be between 23-30. Your Body mass index is 25.68 kg/m. If this is out of the aforementioned range listed, please consider follow up with your Primary Care Provider.  If you are age 46 or younger, your body mass index should be between 19-25. Your Body mass index is 25.68 kg/m. If this is out of the aformentioned range listed, please consider follow up with your Primary Care Provider.   We have given you a prescription for the following medications: Lomotil  Continue taking Immodium 6-8/day.  We have given you samples of the following medication to take: Restora once daily x 1 week.   Thank you,  Dr. Jackquline Denmark

## 2018-07-13 NOTE — Progress Notes (Signed)
Chief Complaint: Diarrhea  Referring Provider:  Ernestene Kiel, MD      ASSESSMENT AND PLAN;   #1.  Diarrhea with negative stool studies for C. difficile x 2.  History of constipation in the past.  Negative colonoscopy 03/2003 Dr Tommie Raymond in [place], [place].  Patient refused repeat colonoscopies. #2.  Internal and external hemorrhoids. #3.  Urinary and fecal incontinence. #4.  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  She is currently off all antibiotics.  Plan: -Trial of Lomotil 1 tablet p.o. 3 times daily.  Watch for constipation. -Keagle's exercises. -Good perirectal hygiene. -I have instructed patient to fill up Proctofoam prescription and start using it twice a day. -Start probiotics.  I have given her samples of Florastor 1 tablet p.o. once a day. -If she is still not better in the next 1 week, check x-ray KUB supine and upright. -Follow-up in 4 to 6 weeks.  Earlier, if still with problems.  HPI:    Miranda Mclaughlin is a 77 y.o. 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  She is currently off all antibiotics.  Her main complaint today is that of diarrhea, rectal incontinence and urinary incontinence.  Describes diarrhea as softer bowel movements, runs out of rectum without any warning.  Has perirectal/perianal burning sensation.  She has a prescription for Proctofoam which she has not filled up yet.  Has been  wearing diapers.  She has history of constipation in the past.  Most recently, she underwent CT scan of the abdomen and pelvis in August 2019 and was found to have significant stool in the colon.  She was given extensive MiraLAX therapy.  This did result in "blowout".  Evaluated by multiple physicians.  C. difficile x 2 has been negative.   She also complains of " hemorrhoids".  Not a candidate for colonoscopy.  Also, she does not want colonoscopy.  Has been off magnesium supplements, fish oil, fiber supplements.  Wants " quick fix".  Accompanied by her daughter.    Past Medical History:  Diagnosis Date  . Bacterial infection    spine and blood   . Constipation   . Heart attack ([place])   . Hemorrhoids   . History of blood clots    currently have one in main artery going to heart   . Hypothyroidism   . Osteoarthritis   . Uterus cancer [facility])     Past Surgical History:  Procedure Laterality Date  . ABDOMINAL HYSTERECTOMY  03/2018   has  22 staples  . Left Breast Bx    . Left knee Surgery      Family History  Problem Relation Age of Onset  . Bone cancer Mother   . Breast cancer Sister   . Cancer Maternal Grandmother        possible stomach  . Breast cancer Maternal Aunt   . Colon cancer Neg Hx     Social History   Tobacco Use  . Smoking status: Never Smoker  . Smokeless tobacco: Never Used  Substance Use Topics  . Alcohol use: Not  Currently  . Drug use: Never    Current Outpatient Medications  Medication Sig Dispense Refill  . busPIRone (BUSPAR) 7.5 MG tablet Take 7.5 mg by mouth 2 (two) times daily.  2  . diclofenac sodium (VOLTAREN) 1 % GEL as needed.    Marland Kitchen ELIQUIS 5 MG TABS tablet 5 mg 2 (two) times daily.     . furosemide (LASIX) 20 MG tablet daily.    Marland Kitchen gabapentin (NEURONTIN) 300 MG capsule Take 300 mg by mouth 3 (three) times daily.     Marland Kitchen levothyroxine (SYNTHROID, LEVOTHROID) 50 MCG tablet Take by mouth daily.    . metoprolol tartrate (LOPRESSOR) 25 MG  tablet Take by mouth 2 (two) times daily.    Marland Kitchen oxyCODONE (OXY IR/ROXICODONE) 5 MG immediate release tablet Take 5 mg by mouth every 4 (four) hours as needed.  0  . ranitidine (ZANTAC) 75 MG tablet Take by mouth as needed.    . traMADol (ULTRAM) 50 MG tablet every 6 (six) hours.  0   No current facility-administered medications for this visit.     Allergies  Allergen Reactions  . Sulfamethoxazole Other (See Comments)    Soreness and bumps  . Vancomycin     Got Red Man. Damage bowel and bladder    Review of Systems:  Constitutional: Denies fever, chills, diaphoresis, appetite change and fatigue.  HEENT: Denies photophobia, eye pain, redness, hearing loss, ear pain, congestion, sore throat, rhinorrhea, sneezing, mouth sores, neck pain, neck stiffness and tinnitus.   Respiratory: Denies SOB, DOE, cough, chest tightness,  and wheezing.   Cardiovascular: Denies chest pain, palpitations and leg swelling.  Genitourinary: Denies dysuria, urgency, frequency, hematuria, flank pain and difficulty urinating.  Musculoskeletal: Denies myalgias, back pain, joint swelling, arthralgias and gait problem.  Skin: No rash.  Neurological: Denies dizziness, seizures, syncope, weakness, light-headedness, numbness and headaches.  Hematological: Denies adenopathy. Easy bruising, personal or family bleeding history  Psychiatric/Behavioral: Has anxiety/depression     Physical Exam:    BP 126/64   Pulse 70   Ht 5' (1.524 m)   Wt 131 lb 8 oz (59.6 kg)   BMI 25.68 kg/m  Filed Weights   07/13/18 0919  Weight: 131 lb 8 oz (59.6 kg)   Constitutional: Frail white female Psychiatric: Very anxious. Behavior is normal. HEENT: Pupils normal.  Conjunctivae are normal. No scleral icterus. Neck supple.  Cardiovascular: Normal rate, regular rhythm. No edema Pulmonary/chest: Effort normal and breath sounds normal. No wheezing, rales or rhonchi. Abdominal: Soft, nondistended. Nontender. Bowel sounds active  throughout. There are no masses palpable. No hepatomegaly.  Well-healed surgical scar.  Small umbilical hernia Rectal: Only inspection was performed, significant stool in the diaper, semisolid, some suggestion of hemorrhoids but not clearly visible due to stool. Seen in presence of Docia Chuck CMA Neurological: Alert and oriented to person place and time.  Uses walker. Discussed extensively with the patient and patient's daughter.  I also reviewed extensive notes.   Carmell Austria, MD 07/13/2018, 10:00 AM  Cc: Ernestene Kiel, MD

## 2018-07-14 DIAGNOSIS — L89313 Pressure ulcer of right buttock, stage 3: Secondary | ICD-10-CM | POA: Diagnosis not present

## 2018-07-14 DIAGNOSIS — L89322 Pressure ulcer of left buttock, stage 2: Secondary | ICD-10-CM | POA: Diagnosis not present

## 2018-07-14 DIAGNOSIS — M4626 Osteomyelitis of vertebra, lumbar region: Secondary | ICD-10-CM | POA: Diagnosis not present

## 2018-07-14 DIAGNOSIS — Z792 Long term (current) use of antibiotics: Secondary | ICD-10-CM | POA: Diagnosis not present

## 2018-07-14 DIAGNOSIS — C541 Malignant neoplasm of endometrium: Secondary | ICD-10-CM | POA: Diagnosis not present

## 2018-07-14 DIAGNOSIS — I110 Hypertensive heart disease with heart failure: Secondary | ICD-10-CM | POA: Diagnosis not present

## 2018-07-14 DIAGNOSIS — E039 Hypothyroidism, unspecified: Secondary | ICD-10-CM | POA: Diagnosis not present

## 2018-07-14 DIAGNOSIS — I82220 Acute embolism and thrombosis of inferior vena cava: Secondary | ICD-10-CM | POA: Diagnosis not present

## 2018-07-14 DIAGNOSIS — I5032 Chronic diastolic (congestive) heart failure: Secondary | ICD-10-CM | POA: Diagnosis not present

## 2018-07-14 DIAGNOSIS — Z7901 Long term (current) use of anticoagulants: Secondary | ICD-10-CM | POA: Diagnosis not present

## 2018-07-15 DIAGNOSIS — I82220 Acute embolism and thrombosis of inferior vena cava: Secondary | ICD-10-CM | POA: Diagnosis not present

## 2018-07-15 DIAGNOSIS — E039 Hypothyroidism, unspecified: Secondary | ICD-10-CM | POA: Diagnosis not present

## 2018-07-15 DIAGNOSIS — C541 Malignant neoplasm of endometrium: Secondary | ICD-10-CM | POA: Diagnosis not present

## 2018-07-15 DIAGNOSIS — M4626 Osteomyelitis of vertebra, lumbar region: Secondary | ICD-10-CM | POA: Diagnosis not present

## 2018-07-15 DIAGNOSIS — I110 Hypertensive heart disease with heart failure: Secondary | ICD-10-CM | POA: Diagnosis not present

## 2018-07-15 DIAGNOSIS — Z792 Long term (current) use of antibiotics: Secondary | ICD-10-CM | POA: Diagnosis not present

## 2018-07-15 DIAGNOSIS — L89322 Pressure ulcer of left buttock, stage 2: Secondary | ICD-10-CM | POA: Diagnosis not present

## 2018-07-15 DIAGNOSIS — Z7901 Long term (current) use of anticoagulants: Secondary | ICD-10-CM | POA: Diagnosis not present

## 2018-07-15 DIAGNOSIS — I5032 Chronic diastolic (congestive) heart failure: Secondary | ICD-10-CM | POA: Diagnosis not present

## 2018-07-15 DIAGNOSIS — L89313 Pressure ulcer of right buttock, stage 3: Secondary | ICD-10-CM | POA: Diagnosis not present

## 2018-07-20 ENCOUNTER — Telehealth: Payer: Self-pay | Admitting: Gastroenterology

## 2018-07-21 NOTE — Telephone Encounter (Signed)
Lets get x-ray KUB-supine and upright Start cholestyramine 4 g p.o. twice daily, 2 hours before or after rest of the medications. Continue Lomotil 1, 3 times a day Continue Imodium as before Call us in 1 week if not better.

## 2018-07-21 NOTE — Telephone Encounter (Signed)
Pt daughter called again she is worry about her mothers diarrhea. Please call [phone]

## 2018-07-21 NOTE — Telephone Encounter (Signed)
Called and spoke with patient's daughter-Tina-reports the patient is going through 10 or more pull ups during the day due to the leaking of the stool, hemorrhoids inflamed due to chronic diarrhea, patient has had (now and in the past) decreased po intake, does partake in protein shakes, however, not much fiber; patient is currently taking the Lomotil as prescribed and also 2 Imodium pills with every large bowel movement; patient has currently taken all of the samples of the Florastor; patient's daughter is calling in for a plan of care in order to help her mother with this issue-(side note-please call the daughter at [phone])  Please advise in order for me to call the patient's daughter back with plan of care;

## 2018-07-22 ENCOUNTER — Other Ambulatory Visit: Payer: Self-pay

## 2018-07-22 ENCOUNTER — Telehealth: Payer: Self-pay | Admitting: Gastroenterology

## 2018-07-22 ENCOUNTER — Other Ambulatory Visit: Payer: Self-pay | Admitting: Gastroenterology

## 2018-07-22 DIAGNOSIS — R197 Diarrhea, unspecified: Secondary | ICD-10-CM

## 2018-07-22 MED ORDER — CHOLESTYRAMINE 4 G PO PACK: 4.0000 g | PACK | Freq: Three times a day (TID) | ORAL | 12 refills | Status: DC

## 2018-07-22 NOTE — Telephone Encounter (Signed)
Left message for patient to call back; Rx sent to [facility];

## 2018-07-23 DIAGNOSIS — I5032 Chronic diastolic (congestive) heart failure: Secondary | ICD-10-CM | POA: Diagnosis not present

## 2018-07-23 DIAGNOSIS — I82220 Acute embolism and thrombosis of inferior vena cava: Secondary | ICD-10-CM | POA: Diagnosis not present

## 2018-07-23 DIAGNOSIS — Z7901 Long term (current) use of anticoagulants: Secondary | ICD-10-CM | POA: Diagnosis not present

## 2018-07-23 DIAGNOSIS — L89322 Pressure ulcer of left buttock, stage 2: Secondary | ICD-10-CM | POA: Diagnosis not present

## 2018-07-23 DIAGNOSIS — I110 Hypertensive heart disease with heart failure: Secondary | ICD-10-CM | POA: Diagnosis not present

## 2018-07-23 DIAGNOSIS — E039 Hypothyroidism, unspecified: Secondary | ICD-10-CM | POA: Diagnosis not present

## 2018-07-23 DIAGNOSIS — C541 Malignant neoplasm of endometrium: Secondary | ICD-10-CM | POA: Diagnosis not present

## 2018-07-23 DIAGNOSIS — Z792 Long term (current) use of antibiotics: Secondary | ICD-10-CM | POA: Diagnosis not present

## 2018-07-23 DIAGNOSIS — L89313 Pressure ulcer of right buttock, stage 3: Secondary | ICD-10-CM | POA: Diagnosis not present

## 2018-07-23 DIAGNOSIS — M4626 Osteomyelitis of vertebra, lumbar region: Secondary | ICD-10-CM | POA: Diagnosis not present

## 2018-07-23 NOTE — Telephone Encounter (Signed)
Called and spoke with patient's daughter -[name]- informed patient of xray order being clarified (verbally with Dr. Lyndel Safe) and is correct in Epic; [name] is also aware of RX being at pharmacy and is awaiting pick up; [name] verbalized understanding of information/instructions; [name] also advised to call back if questions/concerns arise;

## 2018-07-26 NOTE — Progress Notes (Addendum)
Cardiology Office Note:    Date:  07/27/2018   ID:  Rossana, Molchan 1941/04/16, MRN [id number]  PCP:  Ernestene Kiel, MD  Cardiologist:  Shirlee More, MD   Referring MD: Ernestene Kiel, MD  ASSESSMENT:    1. Chronic heart failure with preserved ejection fraction ([place])   2. IVC thrombosis ([place])   3. Chronic anticoagulation   4. Elevated troponin   5. Anemia, unspecified type    PLAN:    In order of problems listed above:  1. She has marked edema and certainly is multifactorial with severe anemia hemoglobin 8.3 IV fluid overloading with hospitalizations and transfusions a total of 5 units of blood postphlebitic with IVC thrombus as well as clearly an element of congestive heart failure with orthopnea and neck vein distention.  We will increase the dose of her loop diuretic to furosemide 40 mg daily check renal function proBNP today and I told her family member who is a pharmacist if unimproved they can give her a second dose asked her to fully sodium restrict and to begin to weigh and record her weight daily.  She will follow-up in 4 weeks.  In particular I do not see an indication I think she is a poor candidate for invasive cardiac procedures and her ejection fraction is normal and I would just give her diuretics to mitigate her edema and I doubt we can completely resolve it with postphlebitic syndrome 2. Continue anticoagulation tumor related I suspect most of her peripheral edema is post thrombotic 3. Continue her current anticoagulant 4. In the setting of sepsis and acute medical illness clinically I do not think she is sustained but I would have called myocardial infarction and she has variable degrees of LV dysfunction on serial echocardiograms however her ejection fraction is normal.  I would not consider her a candidate for invasive cardiac procedures at this time with her overall frailty debilitation and the uncertainty of prognosis and her malignancy. 5. Certainly  complicating her heart failure her weakness and her peripheral edema she follows up with hematology oncology and asked the family to question whether she would benefit from IV iron.   Documentation of Prolonged Non Face-To-Face Time I personally reviewed outside records for today's encounter.  This included review of historical hospital records, office notes, cardiac studies (e.g. Echocardiograms, Stress Tests, Cardiac Catheterizations, Event Monitors, etc), radiologic studies (e.g. MRIs, CTs, Xrays, etc), laboratory data, etc.  The pertinent findings are outlined in my note.  The total non-face to face time spent for record review was 45 minutes.  Next appointment 4 weeks   Medication Adjustments/Labs and Tests Ordered: Current medicines are reviewed at length with the patient today.  Concerns regarding medicines are outlined above.  Orders Placed This Encounter  Procedures  . Basic Metabolic Panel (BMET)  . Pro b natriuretic peptide (BNP)  . CBC  . EKG 12-Lead   Meds ordered this encounter  Medications  . furosemide (LASIX) 40 MG tablet    Sig: Take 1 tablet (40 mg total) by mouth daily.    Dispense:  30 tablet    Refill:  3     Chief Complaint  Patient presents with  . Shortness of Breath  . Congestive Heart Failure  . Leg Swelling    History of Present Illness:    BEYA TIPPS is a 77 y.o. 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  I reviewed records from [facility] admission in September.  She had a TAH/BSO 03/31/2018 for endometrial adenocarcinoma vaginal bleeding her course was uncomplicated postoperative course was  complicated by other issues including MRSA bacteremia acute discitis osteomyelitis L1-L2 and epidural abscess treated with vancomycin and IVC thrombus with anticoagulation.  Postoperative visit with GYN oncology recommended chemotherapy with carboplatinum and paclitaxel when appropriate.  They comment that she has background history of coronary artery disease and MI and one of the discharge  diagnosis is congestive heart failure but I cannot find any cardiology consultation or documentation of deterioration or specific treatment during that admission to the hospital.  She had an echocardiogram performed 03/26/2018 that showed normal left ventricular systolic function EF 55 to 60% she did have segmental LV dysfunction with hypokinesia apex in the mid anterior wall.  Findings were not consistent with elevated left ventricular pressure.  EKG at that time showed sinus rhythm ischemic T wave inversion anterolateral leads which was a new finding as compared to the previous EKG. most recent labs in epic 05/18/2018 shows potassium 5.0 creatinine 1.11 hemoglobin 12.5 she was transfused prior to this.  After the visit I reviewed the records from 03/21/2018 initial admission to [facility].  She is seen by cardiology troponin peaked at 3.5 BNP level greater than 5000 she is felt to have troponin elevation with demand ischemia in the setting of heart failure exacerbation ejection fraction at their hospital is 50 to 55% and she was treated with a minimum dose of loop diuretic furosemide 20 mg/day.  This is a difficult visit with gaps in information I was unaware she had been to [facility] and I was able to access those records.  In the setting of sepsis she had a single elevated troponin 0.5 no chest pain no EKG changes and clinically I would not call this a non-ST elevation MI.  Her first echocardiogram showed localized LV dysfunction not present on a second transesophageal echocardiogram 5 days later  not quite sure what the indication for doing a TEE once.  She has no history of known CAD chest pain anginal discomfort her hemoglobin is 8.3 she feels very weak and has lower extremity edema with IVC thrombus.  She was told she has heart failure and agree with the diagnosis as she has orthopnea and neck vein distention.  She is restarted on a loop diuretic no palpitations syncope she is anticoagulated no bleeding complication.  I am unsure where she has had in her disease process her quality of life is difficult as she has both urinary and fecal incontinence and she is quite weak but she is able to do self-care and she is not short of breath ambulating in her home.  Her family member is a Software engineer she was started on furosemide which actually increased to 20 mg twice daily with reduced edema however she does not sodium restrict and does not weigh daily.  Today in my office she has mildly decompensated heart failure.  After the visit we locate records have been sent from [facility], her proBNP level is elevated 1020 on 07/02/2018 albumin was completed at 2.9 CBC showed a hemoglobin of 9.8 creatinine 0.77 potassium 4.2 and because of her edema shortness of breath and elevated proBNP  level she was referred to the office  Past Medical History:  Diagnosis Date  . Acute kidney injury ([place]) 04/23/2018  . Bacterial infection    spine and blood   . Chronic heart failure with preserved ejection fraction ([place]) 03/25/2018   Last Assessment & Plan:  Not in acute exacerbation. Continue home medications, monitor volume status  . Constipation   . Demand ischemia ([place]) 03/22/2018   Last Assessment & Plan:  --OSH Troponin peaked at 3.5, BNP initially elevated 5000.  Troponin elevation suspect secondary to demand ischemia in the setting of heart failure exacerbation as well as acute infection above --TTE (7/25) showed EF 50 to 55%, impaired diastolic relaxation with distal inferior and mid to distal septal  akinesia and mild apical hypokinesis,  trace mitral regurg, trace aorti  . Endometrial cancer ([place]) 03/22/2018   Last Assessment & Plan:  Discussed with patient and her daughter today that given the type of uterine cancer and the extent of spread, we recommend chemotherapy.  Advise against starting chemotherapy until she has completed her 8 week course of Vancomycin.   --Plan CT C/A/P and spine MRI to ensure improvement/resolution of abscess/osteomyelitis process, imaging will also serve as baseline imaging   . Heart attack ([place])   . Hemorrhoids   . History of blood clots    currently have one in main artery going to heart   . Hypertension 07/27/2018  . Hypothyroidism   . IVC thrombosis ([place]) 03/22/2018   Last Assessment & Plan:  --Patient currently on Lovenox injections --Discontinue Lovenox, initiate Elliquis 5mg  BID, RX provided  . MRSA bacteremia 03/22/2018   Last Assessment & Plan:  --Reviewed documentation from hospitalization and spoke with ID on call, continue Vancomycin until 05/17/18 (8 weeks from 1st negative culture), being monitored with weekly labs at her facility, in contact with OPAT team.   --Reviewed with patient and her daughter who voiced understanding.  . Osteoarthritis   . Uterus cancer [facility])     Past Surgical History:  Procedure Laterality Date  . ABDOMINAL HYSTERECTOMY  03/2018   has  22 staples  . Left Breast Bx    . Left knee Surgery      Current Medications: Current Meds  Medication Sig  . busPIRone (BUSPAR) 7.5 MG tablet Take 7.5 mg by mouth 2 (two) times daily.  . diclofenac sodium (VOLTAREN) 1 % GEL as needed.  . diphenoxylate-atropine (LOMOTIL) 2.5-0.025 MG tablet Take 1 tablet by mouth 3 (three) times daily.  Marland Kitchen ELIQUIS 5 MG TABS tablet 5 mg 2 (two) times daily.   . furosemide (LASIX) 40 MG tablet Take 1 tablet (40 mg total) by mouth daily.  Marland Kitchen gabapentin (NEURONTIN) 300 MG capsule Take 300 mg by mouth 3 (three) times daily.   Marland Kitchen levothyroxine (SYNTHROID,  LEVOTHROID) 50 MCG tablet Take 50 mcg by mouth daily.   . Loperamide HCl (IMODIUM PO) Take 2 tablets by mouth as needed.  . metoprolol tartrate (LOPRESSOR) 25 MG tablet Take by mouth 2 (two) times daily.  Marland Kitchen oxyCODONE (OXY IR/ROXICODONE) 5 MG immediate release tablet Take 5 mg by mouth every 4 (four) hours as needed.  . ranitidine (ZANTAC) 75 MG tablet Take 75 mg by mouth as needed.   . traMADol (ULTRAM) 50 MG tablet Take 50 mg by mouth every 6 (six) hours.   . [DISCONTINUED] furosemide (LASIX) 20 MG tablet Take 20 mg by mouth daily.      Allergies:   Sulfamethoxazole and Vancomycin   Social History  Socioeconomic History  . Marital status: Unknown    Spouse name: Not on file  . Number of children: 1  . Years of education: Not on file  . Highest education level: Not on file  Occupational History  . Not on file  Social Needs  . Financial resource strain: Not on file  . Food insecurity:    Worry: Not on file    Inability: Not on file  . Transportation needs:    Medical: Not on file    Non-medical: Not on file  Tobacco Use  . Smoking status: Never Smoker  . Smokeless tobacco: Never Used  Substance and Sexual Activity  . Alcohol use: Not Currently  . Drug use: Never  . Sexual activity: Not on file  Lifestyle  . Physical activity:    Days per week: Not on file    Minutes per session: Not on file  . Stress: Not on file  Relationships  . Social connections:    Talks on phone: Not on file    Gets together: Not on file    Attends religious service: Not on file    Active member of club or organization: Not on file    Attends meetings of clubs or organizations: Not on file    Relationship status: Not on file  Other Topics Concern  . Not on file  Social History Narrative  . Not on file     Family History: The patient's family history includes Bone cancer in her mother; Breast cancer in her maternal aunt and sister; Cancer in her maternal grandmother. There is no history of  Colon cancer.  ROS:   Review of Systems  Constitution: Positive for malaise/fatigue.  HENT: Negative.   Eyes: Negative.   Cardiovascular: Positive for dyspnea on exertion, leg swelling and orthopnea.  Respiratory: Positive for shortness of breath.   Endocrine: Negative.   Hematologic/Lymphatic: Negative.   Skin: Positive for itching.  Musculoskeletal: Negative.   Gastrointestinal: Positive for bowel incontinence and diarrhea.  Genitourinary: Positive for bladder incontinence.  Neurological: Negative.   Psychiatric/Behavioral: Negative.   Allergic/Immunologic: Negative.    Please see the history of present illness.     All other systems reviewed and are negative.  EKGs/Labs/Other Studies Reviewed:    The following studies were reviewed today: Extensive records from [facility] 2 admissions August reviewed during prior to and after the visit as I was unaware that she had 2 separate admissions.  Also reviewed the records from [facility] preceding the admissions in August and September at [facility]  EKG:  EKG is  ordered today.  The ekg ordered today demonstrates QS in V2 consider anterior septal MI Chest x-ray 04/24/2018 questionable right upper lobe pneumonia Recent Labs: No results found for requested labs within last 8760 hours.  Recent Lipid Panel   August 2019 cholesterol 93 LDL 51 HDL 25 No results found for: CHOL, TRIG, HDL, CHOLHDL, VLDL, LDLCALC, LDLDIRECT  Physical Exam:    VS:  BP (!) 148/58 (BP Location: Right Arm, Patient Position: Sitting, Cuff Size: Normal)   Pulse 64   Ht 5' (1.524 m)   Wt 129 lb 12.8 oz (58.9 kg)   SpO2 95%   BMI 25.35 kg/m     Wt Readings from Last 3 Encounters:  07/27/18 129 lb 12.8 oz (58.9 kg)  07/13/18 131 lb 8 oz (59.6 kg)     GEN: pallpor looks weak Well nourished, well developed in no acute distress HEENT: Normal NECK:  Moderate JVD; No carotid bruits LYMPHATICS: No lymphadenopathy CARDIAC: RRR, no  murmurs, rubs, gallops RESPIRATORY:  Clear to auscultation without rales, wheezing or rhonchi  ABDOMEN: Soft, non-tender, non-distended MUSCULOSKELETAL:  4+ to the waist  edema; No deformity  SKIN: Warm and dry NEUROLOGIC:  Alert and oriented x 3 PSYCHIATRIC:  Normal affect     Signed, Shirlee More, MD  07/27/2018 10:58 AM    [place]

## 2018-07-27 ENCOUNTER — Ambulatory Visit (HOSPITAL_BASED_OUTPATIENT_CLINIC_OR_DEPARTMENT_OTHER)
Admission: RE | Admit: 2018-07-27 | Discharge: 2018-07-27 | Disposition: A | Discharge status: Home / Self Care | Payer: Medicare HMO | Source: Ambulatory Visit | Attending: Gastroenterology | Admitting: Gastroenterology

## 2018-07-27 ENCOUNTER — Ambulatory Visit (INDEPENDENT_AMBULATORY_CARE_PROVIDER_SITE_OTHER): Payer: Medicare HMO | Admitting: Cardiology

## 2018-07-27 ENCOUNTER — Telehealth: Payer: Self-pay | Admitting: Gastroenterology

## 2018-07-27 VITALS — BP 148/58 | HR 64 | Ht 60.0 in | Wt 129.8 lb

## 2018-07-27 DIAGNOSIS — I10 Essential (primary) hypertension: Secondary | ICD-10-CM | POA: Insufficient documentation

## 2018-07-27 DIAGNOSIS — L89313 Pressure ulcer of right buttock, stage 3: Secondary | ICD-10-CM | POA: Diagnosis not present

## 2018-07-27 DIAGNOSIS — D649 Anemia, unspecified: Secondary | ICD-10-CM | POA: Diagnosis not present

## 2018-07-27 DIAGNOSIS — K5900 Constipation, unspecified: Secondary | ICD-10-CM | POA: Diagnosis not present

## 2018-07-27 DIAGNOSIS — I5032 Chronic diastolic (congestive) heart failure: Secondary | ICD-10-CM

## 2018-07-27 DIAGNOSIS — C541 Malignant neoplasm of endometrium: Secondary | ICD-10-CM | POA: Diagnosis not present

## 2018-07-27 DIAGNOSIS — I82220 Acute embolism and thrombosis of inferior vena cava: Secondary | ICD-10-CM

## 2018-07-27 DIAGNOSIS — E039 Hypothyroidism, unspecified: Secondary | ICD-10-CM | POA: Insufficient documentation

## 2018-07-27 DIAGNOSIS — R7989 Other specified abnormal findings of blood chemistry: Secondary | ICD-10-CM

## 2018-07-27 DIAGNOSIS — Z7901 Long term (current) use of anticoagulants: Secondary | ICD-10-CM | POA: Insufficient documentation

## 2018-07-27 DIAGNOSIS — R197 Diarrhea, unspecified: Secondary | ICD-10-CM

## 2018-07-27 DIAGNOSIS — I110 Hypertensive heart disease with heart failure: Secondary | ICD-10-CM | POA: Diagnosis not present

## 2018-07-27 DIAGNOSIS — M6281 Muscle weakness (generalized): Secondary | ICD-10-CM | POA: Diagnosis not present

## 2018-07-27 DIAGNOSIS — R778 Other specified abnormalities of plasma proteins: Secondary | ICD-10-CM | POA: Insufficient documentation

## 2018-07-27 DIAGNOSIS — M4626 Osteomyelitis of vertebra, lumbar region: Secondary | ICD-10-CM | POA: Diagnosis not present

## 2018-07-27 DIAGNOSIS — Z792 Long term (current) use of antibiotics: Secondary | ICD-10-CM | POA: Diagnosis not present

## 2018-07-27 HISTORY — DX: Essential (primary) hypertension: I10

## 2018-07-27 MED ORDER — FUROSEMIDE 40 MG PO TABS: 40.0000 mg | ORAL_TABLET | Freq: Every day | ORAL | 3 refills | Status: DC

## 2018-07-27 NOTE — Patient Instructions (Addendum)
Medication Instructions:  Your physician has recommended you make the following change in your medication:  INCREASE furosemide (lasix) 40 mg: Take 1 tablet daily   If you need a refill on your cardiac medications before your next appointment, please call your pharmacy.   Lab work: Your physician recommends that you return for lab work today: CBC, ProBNP, BMP.   If you have labs (blood work) drawn today and your tests are completely normal, you will receive your results only by: Marland Kitchen MyChart Message (if you have MyChart) OR . A paper copy in the mail If you have any lab test that is abnormal or we need to change your treatment, we will call you to review the results.  Testing/Procedures: You had an EKG today.   Follow-Up: At [facility], you and your health needs are our priority.  As part of our continuing mission to provide you with exceptional heart care, we have created designated Provider Care Teams.  These Care Teams include your primary Cardiologist (physician) and Advanced Practice Providers (APPs -  Physician Assistants and Nurse Practitioners) who all work together to provide you with the care you need, when you need it. You will need a follow up appointment in 1 months.     Any Other Special Instructions Will Be Listed Below (If Applicable).  **Please weigh yourself daily at the same time every day and keep a log of these readings. Bring this log to your next appointment for Dr. Bettina Gavia to review.  **Do NOT add any salt to your food.       Heart Failure  Weigh yourself every morning when you first wake up and record on a calender or note pad, bring this to your office visits. Using a pill tender can help with taking your medications consistently.  Limit your fluid intake to 2 liters daily  Limit your sodium intake to less than 2-3 grams daily. Ask if you need dietary teaching.  If you gain more than 3 pounds (from your dry weight ), double your dose of diuretic for  the day.  If you gain more than 5 pounds (from your dry weight), double your dose of lasix and call your heart failure doctor.  Please do not smoke tobacco since it is very bad for your heart.  Please do not drink alcohol since it can worsen your heart failure.Also avoid OTC nonsteroidal drugs, such as advil, aleve and motrin.  Try to exercise for at least 30 minutes every day because this will help your heart be more efficient. You may be eligible for supervised cardiac rehab, ask your physician.

## 2018-07-28 ENCOUNTER — Telehealth: Payer: Self-pay

## 2018-07-28 LAB — BASIC METABOLIC PANEL
BUN/Creatinine Ratio: 18 (ref 12–28)
BUN: 13 mg/dL (ref 8–27)
CO2: 23 mmol/L (ref 20–29)
Calcium: 9.2 mg/dL (ref 8.7–10.3)
Chloride: 105 mmol/L (ref 96–106)
Creatinine, Ser: 0.71 mg/dL (ref 0.57–1.00)
GFR calc Af Amer: 94 mL/min/{1.73_m2} (ref >59)
GFR calc non Af Amer: 82 mL/min/{1.73_m2} (ref >59)
GLUCOSE: 84 mg/dL (ref 65–99)
Potassium: 4.4 mmol/L (ref 3.5–5.2)
Sodium: 144 mmol/L (ref 134–144)

## 2018-07-28 LAB — CBC
Hematocrit: 30.5 % — ABNORMAL LOW (ref 34.0–46.6)
Hemoglobin: 9.5 g/dL — ABNORMAL LOW (ref 11.1–15.9)
MCH: 28.9 pg (ref 26.6–33.0)
MCHC: 31.1 g/dL — ABNORMAL LOW (ref 31.5–35.7)
MCV: 93 fL (ref 79–97)
Platelets: 231 10*3/uL (ref 150–450)
RBC: 3.29 x10E6/uL — ABNORMAL LOW (ref 3.77–5.28)
RDW: 14.2 % (ref 12.3–15.4)
WBC: 6.7 10*3/uL (ref 3.4–10.8)

## 2018-07-28 LAB — PRO B NATRIURETIC PEPTIDE: NT-PRO BNP: 4557 pg/mL — AB (ref 0–738)

## 2018-07-28 MED ORDER — FUROSEMIDE 40 MG PO TABS: ORAL_TABLET | ORAL | 3 refills | Status: DC

## 2018-07-28 NOTE — Telephone Encounter (Signed)
Called and spoke with patient's daughter-Tina- informed Miranda Mclaughlin to stop previous medications that were prescribed to help with diarrhea as patient's medication regimen was changed to assist with symptoms; Miranda Mclaughlin verbalized understanding of information/instructions; Miranda Mclaughlin was also advised to call back if questions/concerns arise;

## 2018-07-28 NOTE — Telephone Encounter (Signed)
-----   Message from Richardo Priest, MD sent at 07/28/2018  9:40 AM EST ----- Her proBNP level is quite elevated with elevated lites asked her to take an extra 40 mg of furosemide on Mondays Wednesdays and Fridays in the early afternoon

## 2018-07-28 NOTE — Telephone Encounter (Signed)
Patients daughter [name] notified of lab results with elevated proBNP.  [name] advised to increase furosmide to 40mg  once daily and add extra furosemide 40mg  on Monday, Wednesday, and Friday.   Patients daughter [name] agrees to plan and verbalized understanding.

## 2018-07-30 DIAGNOSIS — C541 Malignant neoplasm of endometrium: Secondary | ICD-10-CM | POA: Diagnosis not present

## 2018-07-30 DIAGNOSIS — M4626 Osteomyelitis of vertebra, lumbar region: Secondary | ICD-10-CM | POA: Diagnosis not present

## 2018-07-30 DIAGNOSIS — E039 Hypothyroidism, unspecified: Secondary | ICD-10-CM | POA: Diagnosis not present

## 2018-07-30 DIAGNOSIS — L89313 Pressure ulcer of right buttock, stage 3: Secondary | ICD-10-CM | POA: Diagnosis not present

## 2018-07-30 DIAGNOSIS — I110 Hypertensive heart disease with heart failure: Secondary | ICD-10-CM | POA: Diagnosis not present

## 2018-07-30 DIAGNOSIS — Z7901 Long term (current) use of anticoagulants: Secondary | ICD-10-CM | POA: Diagnosis not present

## 2018-07-30 DIAGNOSIS — Z792 Long term (current) use of antibiotics: Secondary | ICD-10-CM | POA: Diagnosis not present

## 2018-07-30 DIAGNOSIS — M6281 Muscle weakness (generalized): Secondary | ICD-10-CM | POA: Diagnosis not present

## 2018-07-30 DIAGNOSIS — I5032 Chronic diastolic (congestive) heart failure: Secondary | ICD-10-CM | POA: Diagnosis not present

## 2018-07-30 DIAGNOSIS — I82220 Acute embolism and thrombosis of inferior vena cava: Secondary | ICD-10-CM | POA: Diagnosis not present

## 2018-07-31 ENCOUNTER — Telehealth: Payer: Self-pay | Admitting: Gastroenterology

## 2018-07-31 NOTE — Telephone Encounter (Signed)
Called and spoke with patient's daughter -[name]- reiterated the result note to [name] about how patient will need to have formed BM's for more than one episode-also reinforced education pertaining to high fiber diet, increased po intake, appropriate diet for patient to have consistent bowel movements in order for patient to not develop stool burdens/constipation again; [name] verbalized understanding of information/instructions; patient's wife advised to call back if questions/concerns arise;

## 2018-08-04 DIAGNOSIS — Z792 Long term (current) use of antibiotics: Secondary | ICD-10-CM | POA: Diagnosis not present

## 2018-08-04 DIAGNOSIS — I82220 Acute embolism and thrombosis of inferior vena cava: Secondary | ICD-10-CM | POA: Diagnosis not present

## 2018-08-04 DIAGNOSIS — L89313 Pressure ulcer of right buttock, stage 3: Secondary | ICD-10-CM | POA: Diagnosis not present

## 2018-08-04 DIAGNOSIS — M6281 Muscle weakness (generalized): Secondary | ICD-10-CM | POA: Diagnosis not present

## 2018-08-04 DIAGNOSIS — E039 Hypothyroidism, unspecified: Secondary | ICD-10-CM | POA: Diagnosis not present

## 2018-08-04 DIAGNOSIS — M4626 Osteomyelitis of vertebra, lumbar region: Secondary | ICD-10-CM | POA: Diagnosis not present

## 2018-08-04 DIAGNOSIS — I5032 Chronic diastolic (congestive) heart failure: Secondary | ICD-10-CM | POA: Diagnosis not present

## 2018-08-04 DIAGNOSIS — C541 Malignant neoplasm of endometrium: Secondary | ICD-10-CM | POA: Diagnosis not present

## 2018-08-04 DIAGNOSIS — I110 Hypertensive heart disease with heart failure: Secondary | ICD-10-CM | POA: Diagnosis not present

## 2018-08-04 DIAGNOSIS — Z7901 Long term (current) use of anticoagulants: Secondary | ICD-10-CM | POA: Diagnosis not present

## 2018-08-06 DIAGNOSIS — M4626 Osteomyelitis of vertebra, lumbar region: Secondary | ICD-10-CM | POA: Diagnosis not present

## 2018-08-06 DIAGNOSIS — I5032 Chronic diastolic (congestive) heart failure: Secondary | ICD-10-CM | POA: Diagnosis not present

## 2018-08-06 DIAGNOSIS — Z792 Long term (current) use of antibiotics: Secondary | ICD-10-CM | POA: Diagnosis not present

## 2018-08-06 DIAGNOSIS — M6281 Muscle weakness (generalized): Secondary | ICD-10-CM | POA: Diagnosis not present

## 2018-08-06 DIAGNOSIS — Z7901 Long term (current) use of anticoagulants: Secondary | ICD-10-CM | POA: Diagnosis not present

## 2018-08-06 DIAGNOSIS — L89313 Pressure ulcer of right buttock, stage 3: Secondary | ICD-10-CM | POA: Diagnosis not present

## 2018-08-06 DIAGNOSIS — I110 Hypertensive heart disease with heart failure: Secondary | ICD-10-CM | POA: Diagnosis not present

## 2018-08-06 DIAGNOSIS — C541 Malignant neoplasm of endometrium: Secondary | ICD-10-CM | POA: Diagnosis not present

## 2018-08-06 DIAGNOSIS — E039 Hypothyroidism, unspecified: Secondary | ICD-10-CM | POA: Diagnosis not present

## 2018-08-06 DIAGNOSIS — I82220 Acute embolism and thrombosis of inferior vena cava: Secondary | ICD-10-CM | POA: Diagnosis not present

## 2018-08-11 DIAGNOSIS — I5032 Chronic diastolic (congestive) heart failure: Secondary | ICD-10-CM | POA: Diagnosis not present

## 2018-08-11 DIAGNOSIS — Z792 Long term (current) use of antibiotics: Secondary | ICD-10-CM | POA: Diagnosis not present

## 2018-08-11 DIAGNOSIS — M4626 Osteomyelitis of vertebra, lumbar region: Secondary | ICD-10-CM | POA: Diagnosis not present

## 2018-08-11 DIAGNOSIS — Z7901 Long term (current) use of anticoagulants: Secondary | ICD-10-CM | POA: Diagnosis not present

## 2018-08-11 DIAGNOSIS — I110 Hypertensive heart disease with heart failure: Secondary | ICD-10-CM | POA: Diagnosis not present

## 2018-08-11 DIAGNOSIS — E039 Hypothyroidism, unspecified: Secondary | ICD-10-CM | POA: Diagnosis not present

## 2018-08-11 DIAGNOSIS — L89313 Pressure ulcer of right buttock, stage 3: Secondary | ICD-10-CM | POA: Diagnosis not present

## 2018-08-11 DIAGNOSIS — C541 Malignant neoplasm of endometrium: Secondary | ICD-10-CM | POA: Diagnosis not present

## 2018-08-11 DIAGNOSIS — I82220 Acute embolism and thrombosis of inferior vena cava: Secondary | ICD-10-CM | POA: Diagnosis not present

## 2018-08-11 DIAGNOSIS — M6281 Muscle weakness (generalized): Secondary | ICD-10-CM | POA: Diagnosis not present

## 2018-08-13 DIAGNOSIS — I5032 Chronic diastolic (congestive) heart failure: Secondary | ICD-10-CM | POA: Diagnosis not present

## 2018-08-13 DIAGNOSIS — L89313 Pressure ulcer of right buttock, stage 3: Secondary | ICD-10-CM | POA: Diagnosis not present

## 2018-08-13 DIAGNOSIS — I110 Hypertensive heart disease with heart failure: Secondary | ICD-10-CM | POA: Diagnosis not present

## 2018-08-13 DIAGNOSIS — Z792 Long term (current) use of antibiotics: Secondary | ICD-10-CM | POA: Diagnosis not present

## 2018-08-13 DIAGNOSIS — M4626 Osteomyelitis of vertebra, lumbar region: Secondary | ICD-10-CM | POA: Diagnosis not present

## 2018-08-13 DIAGNOSIS — I82220 Acute embolism and thrombosis of inferior vena cava: Secondary | ICD-10-CM | POA: Diagnosis not present

## 2018-08-13 DIAGNOSIS — E039 Hypothyroidism, unspecified: Secondary | ICD-10-CM | POA: Diagnosis not present

## 2018-08-13 DIAGNOSIS — Z7901 Long term (current) use of anticoagulants: Secondary | ICD-10-CM | POA: Diagnosis not present

## 2018-08-13 DIAGNOSIS — M6281 Muscle weakness (generalized): Secondary | ICD-10-CM | POA: Diagnosis not present

## 2018-08-13 DIAGNOSIS — C541 Malignant neoplasm of endometrium: Secondary | ICD-10-CM | POA: Diagnosis not present

## 2018-08-17 DIAGNOSIS — I110 Hypertensive heart disease with heart failure: Secondary | ICD-10-CM | POA: Diagnosis not present

## 2018-08-17 DIAGNOSIS — Z7901 Long term (current) use of anticoagulants: Secondary | ICD-10-CM | POA: Diagnosis not present

## 2018-08-17 DIAGNOSIS — C541 Malignant neoplasm of endometrium: Secondary | ICD-10-CM | POA: Diagnosis not present

## 2018-08-17 DIAGNOSIS — I82220 Acute embolism and thrombosis of inferior vena cava: Secondary | ICD-10-CM | POA: Diagnosis not present

## 2018-08-17 DIAGNOSIS — E039 Hypothyroidism, unspecified: Secondary | ICD-10-CM | POA: Diagnosis not present

## 2018-08-17 DIAGNOSIS — L89313 Pressure ulcer of right buttock, stage 3: Secondary | ICD-10-CM | POA: Diagnosis not present

## 2018-08-17 DIAGNOSIS — M6281 Muscle weakness (generalized): Secondary | ICD-10-CM | POA: Diagnosis not present

## 2018-08-17 DIAGNOSIS — M4626 Osteomyelitis of vertebra, lumbar region: Secondary | ICD-10-CM | POA: Diagnosis not present

## 2018-08-17 DIAGNOSIS — I5032 Chronic diastolic (congestive) heart failure: Secondary | ICD-10-CM | POA: Diagnosis not present

## 2018-08-17 DIAGNOSIS — Z792 Long term (current) use of antibiotics: Secondary | ICD-10-CM | POA: Diagnosis not present

## 2018-08-27 ENCOUNTER — Encounter: Payer: Self-pay | Admitting: Cardiology

## 2018-08-27 ENCOUNTER — Ambulatory Visit (INDEPENDENT_AMBULATORY_CARE_PROVIDER_SITE_OTHER): Payer: PPO | Admitting: Cardiology

## 2018-08-27 VITALS — BP 154/60 | HR 68 | Ht 61.0 in | Wt 119.0 lb

## 2018-08-27 DIAGNOSIS — Z7901 Long term (current) use of anticoagulants: Secondary | ICD-10-CM | POA: Diagnosis not present

## 2018-08-27 DIAGNOSIS — R778 Other specified abnormalities of plasma proteins: Secondary | ICD-10-CM

## 2018-08-27 DIAGNOSIS — N179 Acute kidney failure, unspecified: Secondary | ICD-10-CM | POA: Diagnosis not present

## 2018-08-27 DIAGNOSIS — I5032 Chronic diastolic (congestive) heart failure: Secondary | ICD-10-CM

## 2018-08-27 DIAGNOSIS — R7989 Other specified abnormal findings of blood chemistry: Secondary | ICD-10-CM

## 2018-08-27 MED ORDER — FUROSEMIDE 40 MG PO TABS: ORAL_TABLET | ORAL | 0 refills | Status: DC

## 2018-08-27 NOTE — Progress Notes (Signed)
Cardiology Office Note:    Date:  08/28/2018   ID:  Miranda, Mclaughlin Dec 27, 1940, [name]  PCP:  Ernestene Kiel, MD  Cardiologist:  Shirlee More, MD    Referring MD: Ernestene Kiel, MD    ASSESSMENT:    1. Chronic heart failure with preserved ejection fraction ([place])   2. Acute kidney injury ([place])   3. Chronic anticoagulation   4. Elevated troponin    PLAN:    In order of problems listed above:  1. Improved her weight is down 10 to 15 pounds has been stable she has much less edema no longer short of breath and I would continue her current dose of diuretic.  She still has residual edema and I suspect this is probably due to postphlebitic syndrome with her IVC thrombus.  Heart failure is compensators class I recheck renal function and proBNP level today 2. Recheck renal function 3. Continue her anticoagulant with IVC thrombus 4. Occurred in the setting of SIRS at this time I would not advise an ischemia evaluation especially with her comorbidities 5. Anemia has been a significant issue last hemoglobin less than 10 we will recheck today she has seen and is due to follow-up with oncology   Next appointment: 3 months   Medication Adjustments/Labs and Tests Ordered: Current medicines are reviewed at length with the patient today.  Concerns regarding medicines are outlined above.  Orders Placed This Encounter  Procedures  . Basic Metabolic Panel (BMET)  . Pro b natriuretic peptide (BNP)  . CBC   Meds ordered this encounter  Medications  . furosemide (LASIX) 40 MG tablet    Sig: Take 1 tablet (40mg ) every morning and take an extra tablet (40mg ) in the afternoon if weight > 117 pounds.    Dispense:  180 tablet    Refill:  0    Chief Complaint  Patient presents with  . Follow-up  . Congestive Heart Failure    History of Present Illness:    Miranda Mclaughlin is a 78 y.o. 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   I reviewed the records from 03/21/2018 initial admission to [facility].  She is seen by cardiology troponin peaked at 3.5 BNP level greater than 5000 she is felt to have troponin elevation with demand ischemia in the setting of heart failure exacerbation ejection fraction at their hospital is 50 to 55% and she was treated with a minimum dose of loop diuretic furosemide 20 mg/day.  Compliance with diet, lifestyle and medications: Yes  In every way she has made a good recovery strength and endurance are better she has lost somewhere in the range of 11 pounds since her last weight she is not short of breath and has a very small amount of residual edema.  No chest pain orthopnea palpitation or syncope recheck renal function and potassium today and proBNP level.  I adjusted her diuretics to be weight-based.  He has had no chest pain Past Medical History:  Diagnosis Date  . Acute kidney injury ([place]) 04/23/2018  . Bacterial infection    spine and blood   . Chronic heart failure with preserved ejection fraction ([place]) 03/25/2018  Last Assessment & Plan:  Not in acute exacerbation. Continue home medications, monitor volume status  . Constipation   . Demand ischemia ([place]) 03/22/2018   Last Assessment & Plan:  --OSH Troponin peaked at 3.5, BNP initially elevated 5000.  Troponin elevation suspect secondary to demand ischemia in the setting of heart failure exacerbation as well as acute infection above --TTE (7/25) showed EF 50 to 55%, impaired diastolic relaxation with distal inferior and mid to distal septal akinesia and mild apical hypokinesis,  trace mitral regurg, trace aorti  .  Endometrial cancer ([place]) 03/22/2018   Last Assessment & Plan:  Discussed with patient and her daughter today that given the type of uterine cancer and the extent of spread, we recommend chemotherapy.  Advise against starting chemotherapy until she has completed her 8 week course of Vancomycin.   --Plan CT C/A/P and spine MRI to ensure improvement/resolution of abscess/osteomyelitis process, imaging will also serve as baseline imaging   . Heart attack ([place])   . Hemorrhoids   . History of blood clots    currently have one in main artery going to heart   . Hypertension 07/27/2018  . Hypothyroidism   . 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 MRSA bacteremia 03/22/2018   Last Assessment & Plan:  --Reviewed documentation from hospitalization and spoke with ID on call, continue Vancomycin until 05/17/18 (8 weeks from 1st negative culture), being monitored with weekly labs at her facility, in contact with OPAT team.   --Reviewed with patient and her daughter who voiced understanding.  . Osteoarthritis   . Uterus cancer [facility])     Past Surgical History:  Procedure Laterality Date  . ABDOMINAL HYSTERECTOMY  03/2018   has  22 staples  . Left Breast Bx    . Left knee Surgery      Current Medications: Current Meds  Medication Sig  . busPIRone (BUSPAR) 7.5 MG tablet Take 7.5 mg by mouth 2 (two) times daily.  Marland Kitchen ELIQUIS 5 MG TABS tablet 5 mg 2 (two) times daily.   . furosemide (LASIX) 40 MG tablet Take 1 tablet (40mg ) every morning and take an extra tablet (40mg ) in the afternoon if weight > 117 pounds.  . gabapentin (NEURONTIN) 300 MG capsule Take 300 mg by mouth 3 (three) times daily.   Marland Kitchen levothyroxine (SYNTHROID, LEVOTHROID) 50 MCG tablet Take 50 mcg by mouth daily.   . metoprolol tartrate (LOPRESSOR) 25 MG tablet Take by mouth 2 (two) times daily.  Marland Kitchen oxyCODONE (OXY IR/ROXICODONE)  5 MG immediate release tablet Take 5 mg by mouth every 4 (four) hours as needed.  . ranitidine (ZANTAC) 75 MG tablet Take 75 mg by mouth as needed.   . traMADol (ULTRAM) 50 MG tablet Take 50 mg by mouth every 6 (six) hours.   . [DISCONTINUED] furosemide (LASIX) 40 MG tablet Take 1 tablet (40mg ) every morning and take an extra tablet (40mg ) on Mondays, Wednesdays, and Fridays (Patient taking differently: Take 40 mg by mouth daily. )     Allergies:   Sulfamethoxazole and Vancomycin   Social History   Socioeconomic History  . Marital status: Unknown    Spouse name: Not on file  . Number of children: 1  . Years of education: Not on file  . Highest education level: Not on file  Occupational History  . Not on file  Social Needs  . Emergency planning/management officer  strain: Not on file  . Food insecurity:    Worry: Not on file    Inability: Not on file  . Transportation needs:    Medical: Not on file    Non-medical: Not on file  Tobacco Use  . Smoking status: Never Smoker  . Smokeless tobacco: Never Used  Substance and Sexual Activity  . Alcohol use: Not Currently  . Drug use: Never  . Sexual activity: Not on file  Lifestyle  . Physical activity:    Days per week: Not on file    Minutes per session: Not on file  . Stress: Not on file  Relationships  . Social connections:    Talks on phone: Not on file    Gets together: Not on file    Attends religious service: Not on file    Active member of club or organization: Not on file    Attends meetings of clubs or organizations: Not on file    Relationship status: Not on file  Other Topics Concern  . Not on file  Social History Narrative  . Not on file     Family History: The patient's family history includes Bone cancer in her mother; Breast cancer in her maternal aunt and sister; Cancer in her maternal grandmother. There is no history of Colon cancer. ROS:   Please see the history of present illness.    All other systems reviewed and are  negative.  EKGs/Labs/Other Studies Reviewed:    The following studies were reviewed today:    Recent Labs: 08/27/2018: BUN 26; Creatinine, Ser 0.87; Hemoglobin 10.1; NT-Pro BNP 1,369; Platelets 141; Potassium 4.4; Sodium 139  Recent Lipid Panel No results found for: CHOL, TRIG, HDL, CHOLHDL, VLDL, LDLCALC, LDLDIRECT  Physical Exam:    VS:  BP (!) 154/60 (BP Location: Left Arm, Patient Position: Sitting, Cuff Size: Normal)   Pulse 68   Ht 5\' 1"  (1.549 m)   Wt 119 lb (54 kg)   BMI 22.48 kg/m     Wt Readings from Last 3 Encounters:  08/27/18 119 lb (54 kg)  07/27/18 129 lb 12.8 oz (58.9 kg)  07/13/18 131 lb 8 oz (59.6 kg)     GEN: looks improved Well nourished, well developed in no acute distress HEENT: Normal NECK: No JVD; No carotid bruits LYMPHATICS: No lymphadenopathy CARDIAC: RRR, no murmurs, rubs, gallops RESPIRATORY:  Clear to auscultation without rales, wheezing or rhonchi  ABDOMEN: Soft, non-tender, non-distended MUSCULOSKELETAL:  1-2+ pretibial  edema; No deformity  SKIN: Warm and dry NEUROLOGIC:  Alert and oriented x 3 PSYCHIATRIC:  Normal affect    Signed, Shirlee More, MD  08/28/2018 7:39 AM    [place]

## 2018-08-27 NOTE — Patient Instructions (Signed)
Medication Instructions:  Your physician has recommended you make the following change in your medication:   CHANGE furosemide (lasix) 40 mg: Take 1 tablet (40 mg) in the morning and take an extra tablet in the afternoon if weight is greater than 117 pounds  If you need a refill on your cardiac medications before your next appointment, please call your pharmacy.   Lab work: Your physician recommends that you return for lab work today: CBC, ProBNP, BMP.   If you have labs (blood work) drawn today and your tests are completely normal, you will receive your results only by: Marland Kitchen MyChart Message (if you have MyChart) OR . A paper copy in the mail If you have any lab test that is abnormal or we need to change your treatment, we will call you to review the results.  Testing/Procedures: None  Follow-Up: At [facility], you and your health needs are our priority.  As part of our continuing mission to provide you with exceptional heart care, we have created designated Provider Care Teams.  These Care Teams include your primary Cardiologist (physician) and Advanced Practice Providers (APPs -  Physician Assistants and Nurse Practitioners) who all work together to provide you with the care you need, when you need it. You will need a follow up appointment in 3 months.  Please call our office 2 months in advance to schedule this appointment.

## 2018-08-28 LAB — CBC
Hematocrit: 30.9 % — ABNORMAL LOW (ref 34.0–46.6)
Hemoglobin: 10.1 g/dL — ABNORMAL LOW (ref 11.1–15.9)
MCH: 28.5 pg (ref 26.6–33.0)
MCHC: 32.7 g/dL (ref 31.5–35.7)
MCV: 87 fL (ref 79–97)
Platelets: 141 10*3/uL — ABNORMAL LOW (ref 150–450)
RBC: 3.54 x10E6/uL — AB (ref 3.77–5.28)
RDW: 13.1 % (ref 11.7–15.4)
WBC: 4.4 10*3/uL (ref 3.4–10.8)

## 2018-08-28 LAB — BASIC METABOLIC PANEL
BUN/Creatinine Ratio: 30 — ABNORMAL HIGH (ref 12–28)
BUN: 26 mg/dL (ref 8–27)
CALCIUM: 9.3 mg/dL (ref 8.7–10.3)
CO2: 23 mmol/L (ref 20–29)
Chloride: 99 mmol/L (ref 96–106)
Creatinine, Ser: 0.87 mg/dL (ref 0.57–1.00)
GFR calc Af Amer: 74 mL/min/{1.73_m2} (ref >59)
GFR calc non Af Amer: 64 mL/min/{1.73_m2} (ref >59)
Glucose: 134 mg/dL — ABNORMAL HIGH (ref 65–99)
Potassium: 4.4 mmol/L (ref 3.5–5.2)
Sodium: 139 mmol/L (ref 134–144)

## 2018-08-28 LAB — PRO B NATRIURETIC PEPTIDE: NT-Pro BNP: 1369 pg/mL — ABNORMAL HIGH (ref 0–738)

## 2018-09-14 DIAGNOSIS — I509 Heart failure, unspecified: Secondary | ICD-10-CM | POA: Diagnosis not present

## 2018-09-14 DIAGNOSIS — Z6823 Body mass index (BMI) 23.0-23.9, adult: Secondary | ICD-10-CM | POA: Diagnosis not present

## 2018-09-14 DIAGNOSIS — I10 Essential (primary) hypertension: Secondary | ICD-10-CM | POA: Diagnosis not present

## 2018-09-14 DIAGNOSIS — E039 Hypothyroidism, unspecified: Secondary | ICD-10-CM | POA: Diagnosis not present

## 2018-09-14 DIAGNOSIS — I82220 Acute embolism and thrombosis of inferior vena cava: Secondary | ICD-10-CM | POA: Diagnosis not present

## 2018-09-14 DIAGNOSIS — M150 Primary generalized (osteo)arthritis: Secondary | ICD-10-CM | POA: Diagnosis not present

## 2018-10-20 ENCOUNTER — Encounter: Payer: Self-pay | Admitting: Cardiology

## 2018-10-20 ENCOUNTER — Ambulatory Visit (INDEPENDENT_AMBULATORY_CARE_PROVIDER_SITE_OTHER): Payer: PPO | Admitting: Cardiology

## 2018-10-20 VITALS — BP 136/58 | HR 62 | Ht 61.0 in | Wt 118.8 lb

## 2018-10-20 DIAGNOSIS — D649 Anemia, unspecified: Secondary | ICD-10-CM

## 2018-10-20 DIAGNOSIS — I5032 Chronic diastolic (congestive) heart failure: Secondary | ICD-10-CM | POA: Diagnosis not present

## 2018-10-20 MED ORDER — PANTOPRAZOLE SODIUM 40 MG PO TBEC: 40.0000 mg | DELAYED_RELEASE_TABLET | Freq: Every day | ORAL | 1 refills | Status: DC

## 2018-10-20 MED ORDER — TORSEMIDE 20 MG PO TABS: 40.0000 mg | ORAL_TABLET | Freq: Two times a day (BID) | ORAL | 1 refills | Status: DC

## 2018-10-20 MED ORDER — METOLAZONE 2.5 MG PO TABS: ORAL_TABLET | ORAL | 0 refills | Status: DC

## 2018-10-20 NOTE — Progress Notes (Signed)
Cardiology Office Note:    Date:  10/20/2018   ID:  [name], [name] Jun 05, 1941, MRN [id number]  PCP:  Ernestene Kiel, MD  Cardiologist:  Shirlee More, MD    Referring MD: Ernestene Kiel, MD    ASSESSMENT:    No diagnosis found. PLAN:    In order of problems listed above:   1. Chronic diastolic heart failure decompensated diuretic resistant switch to torsemide and metolazone if needed.  If she fails to respond will need IV diuretic.  Although she has other reasons for edema and anasarca including her malignancy and postphlebitic syndrome clinically she has decompensated heart failure check BMP and proBNP today. 2. Chronic anticoagulation continue she has IVC thrombus related to malignancy 3. Hypertension stable 4. Gabapentin as part of the problem with sodium retention I asked her to discontinue or decrease 50%   Next appointment: 3 weeks   Medication Adjustments/Labs and Tests Ordered: Current medicines are reviewed at length with the patient today.  Concerns regarding medicines are outlined above.  No orders of the defined types were placed in this encounter.  No orders of the defined types were placed in this encounter.   Chief Complaint  Patient presents with  . Follow-up  . Congestive Heart Failure    History of Present Illness:    Miranda Mclaughlin is a 78 y.o. 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  She is seen by cardiology troponin peaked at 3.5 BNP level greater than 5000 she is felt to have troponin elevation with demand ischemia in the setting of heart failure exacerbation ejection fraction at their hospital is 50 to 55% and she was treated  with a minimum dose of loop diuretic furosemide 20 mg/day.    Compliance with diet, lifestyle and medications: Yes  Despite restricting salt and increasing her furosemide her weight is gone up 4 pounds she is increasingly edematous her abdomen is distended she is short of breath bending over and is quite uncomfortable.  Unfortunately she is taking gabapentin.  She has diuretic resistant she will discontinue furosemide she take a higher dose of torsemide 40 mg twice daily and her daughter who is a Occupational psychologist has instructions to initiate metolazone if needed 2 and half milligrams every other day to get her weight back down under 117 pounds.  If she fails to respond she will need IV diuretic.  No chest pain palpitation she does not have orthopnea.  She tolerates her anticoagulant without bleeding complication Past Medical History:  Diagnosis Date  . Acute kidney injury ([place]) 04/23/2018  . Bacterial infection    spine and blood   . Chronic heart failure with preserved ejection fraction ([place]) 03/25/2018   Last Assessment & Plan:  Not in acute exacerbation. Continue home medications, monitor volume status  . Constipation   . Demand ischemia ([place]) 03/22/2018   Last Assessment & Plan:  --OSH Troponin peaked at 3.5, BNP initially elevated 5000.  Troponin elevation suspect secondary to demand ischemia in the setting of heart failure exacerbation as well as acute infection above --TTE (7/25) showed EF 50 to 55%, impaired diastolic relaxation with distal inferior and mid to distal septal akinesia and mild apical hypokinesis,  trace mitral regurg, trace  aorti  . Endometrial cancer ([place]) 03/22/2018   Last Assessment & Plan:  Discussed with patient and her daughter today that given the type of uterine cancer and the extent of spread, we recommend chemotherapy.  Advise against starting chemotherapy until she has completed her 8 week course of Vancomycin.   --Plan CT C/A/P and spine MRI to ensure improvement/resolution  of abscess/osteomyelitis process, imaging will also serve as baseline imaging   . Heart attack ([place])   . Hemorrhoids   . History of blood clots    currently have one in main artery going to heart   . Hypertension 07/27/2018  . Hypothyroidism   . 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 MRSA bacteremia 03/22/2018   Last Assessment & Plan:  --Reviewed documentation from hospitalization and spoke with ID on call, continue Vancomycin until 05/17/18 (8 weeks from 1st negative culture), being monitored with weekly labs at her facility, in contact with OPAT team.   --Reviewed with patient and her daughter who voiced understanding.  . Osteoarthritis   . Uterus cancer [facility])     Past Surgical History:  Procedure Laterality Date  . ABDOMINAL HYSTERECTOMY  03/2018   has  22 staples  . Left Breast Bx    . Left knee Surgery      Current Medications: Current Meds  Medication Sig  . busPIRone (BUSPAR) 7.5 MG tablet Take 7.5 mg by mouth 2 (two) times daily.  . diclofenac sodium (VOLTAREN) 1 % GEL as needed.  Marland Kitchen ELIQUIS 5 MG TABS tablet 5 mg 2 (two) times daily.   . furosemide (LASIX) 40 MG tablet Take 1 tablet (40mg ) every morning and take an extra tablet (40mg ) in the afternoon if weight > 117 pounds. (Patient taking differently: Take 1 tablet (80mg ) every morning and take an extra tablet (40mg ) in the afternoon if weight > 117 pounds.)  . gabapentin (NEURONTIN) 300 MG capsule Take 300 mg by mouth 3 (three) times daily.   Marland Kitchen levothyroxine (SYNTHROID, LEVOTHROID) 50 MCG tablet Take 50 mcg by mouth daily.   . metoprolol tartrate (LOPRESSOR) 25 MG tablet Take by mouth 2 (two) times daily.  Marland Kitchen oxyCODONE (OXY IR/ROXICODONE) 5 MG immediate release tablet Take 5 mg by mouth every 4 (four) hours as needed.  . ranitidine (ZANTAC) 75 MG tablet Take 75 mg by mouth as needed.   . traMADol  (ULTRAM) 50 MG tablet Take 50 mg by mouth every 6 (six) hours.      Allergies:   Sulfamethoxazole and Vancomycin   Social History   Socioeconomic History  . Marital status: Unknown    Spouse name: Not on file  . Number of children: 1  . Years of education: Not on file  . Highest education level: Not on file  Occupational History  . Not on file  Social Needs  . Financial resource strain: Not on file  . Food insecurity:    Worry: Not on file    Inability: Not on file  . Transportation needs:    Medical: Not on file    Non-medical: Not on file  Tobacco Use  . Smoking status: Never Smoker  . Smokeless tobacco: Never Used  Substance and Sexual Activity  . Alcohol use: Not Currently  . Drug use: Never  . Sexual activity: Not on file  Lifestyle  . Physical activity:    Days per week: Not on  file    Minutes per session: Not on file  . Stress: Not on file  Relationships  . Social connections:    Talks on phone: Not on file    Gets together: Not on file    Attends religious service: Not on file    Active member of club or organization: Not on file    Attends meetings of clubs or organizations: Not on file    Relationship status: Not on file  Other Topics Concern  . Not on file  Social History Narrative  . Not on file     Family History: The patient's family history includes Bone cancer in her mother; Breast cancer in her maternal aunt and sister; Cancer in her maternal grandmother. There is no history of Colon cancer. ROS:   Please see the history of present illness.    All other systems reviewed and are negative.  EKGs/Labs/Other Studies Reviewed:    The following studies were reviewed today:   Recent Labs: 08/27/2018: BUN 26; Creatinine, Ser 0.87; Hemoglobin 10.1; NT-Pro BNP 1,369; Platelets 141; Potassium 4.4; Sodium 139  Recent Lipid Panel No results found for: CHOL, TRIG, HDL, CHOLHDL, VLDL, LDLCALC, LDLDIRECT  Physical Exam:    VS:  BP (!) 136/58 (BP  Location: Right Arm, Patient Position: Sitting, Cuff Size: Normal)   Pulse 62   Ht 5\' 1"  (1.549 m)   Wt 118 lb 12.8 oz (53.9 kg)   SpO2 93%   BMI 22.45 kg/m     Wt Readings from Last 3 Encounters:  10/20/18 118 lb 12.8 oz (53.9 kg)  08/27/18 119 lb (54 kg)  07/27/18 129 lb 12.8 oz (58.9 kg)     GEN: She looks very frail chronically ill well nourished, well developed in no acute distress HEENT: Normal NECK: Moderate JVD; No carotid bruits LYMPHATICS: No lymphadenopathy CARDIAC: RRR, no murmurs, rubs, gallops RESPIRATORY:  Clear to auscultation without rales, wheezing or rhonchi  ABDOMEN: Soft, non-tender, non-distended MUSCULOSKELETAL: 2-3+ to the knee bilateralo edema; No deformity  SKIN: Warm and dry NEUROLOGIC:  Alert and oriented x 3 PSYCHIATRIC:  Normal affect    Signed, Shirlee More, MD  10/20/2018 11:56 AM    [place]

## 2018-10-20 NOTE — Patient Instructions (Signed)
Medication Instructions:  Your physician has recommended you make the following change in your medication:   STOP furosemide (lasix)   START torsemide (demadex) 20 mg: Take 2 tablets twice daily  START metolazone (zaroxolyn) 2.5 mg: If weight is greater than 117 after 2 days of taking torsemide as directed, take 1 tablet every other day 30 minutes before taking torsemide START pantoprazole (protonix) 40 mg: Take 1 tablet daily   If you need a refill on your cardiac medications before your next appointment, please call your pharmacy.   Lab work: Your physician recommends that you return for lab work today: BMP, ProBNP.   If you have labs (blood work) drawn today and your tests are completely normal, you will receive your results only by: Marland Kitchen MyChart Message (if you have MyChart) OR . A paper copy in the mail If you have any lab test that is abnormal or we need to change your treatment, we will call you to review the results.  Testing/Procedures: None  Follow-Up: At [facility], you and your health needs are our priority.  As part of our continuing mission to provide you with exceptional heart care, we have created designated Provider Care Teams.  These Care Teams include your primary Cardiologist (physician) and Advanced Practice Providers (APPs -  Physician Assistants and Nurse Practitioners) who all work together to provide you with the care you need, when you need it. You will need a follow up appointment in 3 weeks.       Torsemide tablets What is this medicine? TORSEMIDE (TORE se mide) is a diuretic. It helps you make more urine and to lose salt and excess water from your body. This medicine is used to treat high blood pressure, and edema or swelling from heart, kidney, or liver disease. This medicine may be used for other purposes; ask your health care provider or pharmacist if you have questions. COMMON BRAND NAME(S): Demadex What should I tell my health care provider before  I take this medicine? They need to know if you have any of these conditions: -abnormal blood electrolytes -diabetes -gout -heart disease -kidney disease -liver disease -small amounts of urine, or difficulty passing urine -an unusual or allergic reaction to torsemide, sulfa drugs, other medicines, foods, dyes, or preservatives -pregnant or trying to get pregnant -breast-feeding How should I use this medicine? Take this medicine by mouth with a glass of water. Follow the directions on the prescription label. You may take this medicine with or without food. If it upsets your stomach, take it with food or milk. Do not take your medicine more often than directed. Remember that you will need to pass more urine after taking this medicine. Do not take your medicine at a time of day that will cause you problems. Do not take at bedtime. Talk to your pediatrician regarding the use of this medicine in children. Special care may be needed. Overdosage: If you think you have taken too much of this medicine contact a poison control center or emergency room at once. NOTE: This medicine is only for you. Do not share this medicine with others. What if I miss a dose? If you miss a dose, take it as soon as you can. If it is almost time for your next dose, take only that dose. Do not take double or extra doses. What may interact with this medicine? -alcohol -certain antibiotics given by injection certain heart medicines like digoxin -diuretics -lithium -medicines for diabetes -medicines for blood pressure -medicines for  cholesterol like cholestyramine -medicines that relax muscles for surgery -NSAIDs, medicines for pain and inflammation, like ibuprofen or naproxen -OTC supplements like ginseng and ephedra -probenecid -steroid medicines like prednisone or cortisone This list may not describe all possible interactions. Give your health care provider a list of all the medicines, herbs, non-prescription  drugs, or dietary supplements you use. Also tell them if you smoke, drink alcohol, or use illegal drugs. Some items may interact with your medicine. What should I watch for while using this medicine? Visit your doctor or health care professional for regular checks on your progress. Check your blood pressure regularly. Ask your doctor or health care professional what your blood pressure should be, and when you should contact him or her. If you are a diabetic, check your blood sugar as directed. You may need to be on a special diet while taking this medicine. Check with your doctor. Also, ask how many glasses of fluid you need to drink a day. You must not get dehydrated. You may get drowsy or dizzy. Do not drive, use machinery, or do anything that needs mental alertness until you know how this drug affects you. Do not stand or sit up quickly, especially if you are an older patient. This reduces the risk of dizzy or fainting spells. Alcohol can make you more drowsy and dizzy. Avoid alcoholic drinks. What side effects may I notice from receiving this medicine? Side effects that you should report to your doctor or health care professional as soon as possible: -allergic reactions such as skin rash or itching, hives, swelling of the lips, mouth, tongue or throat -blood in urine or stool -dry mouth -hearing loss or ringing in the ears -irregular heartbeat -muscle pain, weakness or cramps -pain or difficulty passing urine -unusually weak or tired -vomiting or diarrhea Side effects that usually do not require medical attention (report to your doctor or health care professional if they continue or are bothersome): -dizzy or lightheaded -headache -increased thirst -passing large amounts of urine -sexual difficulties -stomach pain, upset or nausea This list may not describe all possible side effects. Call your doctor for medical advice about side effects. You may report side effects to FDA at  1-800-FDA-1088. Where should I keep my medicine? Keep out of the reach of children. Store at room temperature between 15 and 30 degrees C (59 and 86 degrees F). Throw away any unused medicine after the expiration date. NOTE: This sheet is a summary. It may not cover all possible information. If you have questions about this medicine, talk to your doctor, pharmacist, or health care provider.  2019 Elsevier/Gold Standard (2008-04-21 11:35:45)    Metolazone tablets What is this medicine? METOLAZONE (me TOLE a zone) is a diuretic. It increases the amount of urine passed, which causes the body to lose salt and water. This medicine is used to treat high blood pressure. It is also reduces the swelling and water retention caused by heart or kidney disease. This medicine may be used for other purposes; ask your health care provider or pharmacist if you have questions. COMMON BRAND NAME(S): Mykrox, Zaroxolyn What should I tell my health care provider before I take this medicine? They need to know if you have any of these conditions: -diabetes -gout -immune system problems, like lupus -kidney disease -liver disease -pancreatitis -small amount of urine or difficulty passing urine -an unusual or allergic reaction to metolazone, sulfa drugs, other medicines, foods, dyes, or preservatives -pregnant or trying to get pregnant -breast-feeding How should  I use this medicine? Take this medicine by mouth with a glass of water. Follow the directions on the prescription label. Remember that you will need to pass urine frequently after taking this medicine. Do not take your doses at a time of day that will cause you problems. Do not take at bedtime. Take your medicine at regular intervals. Do not take your medicine more often than directed. Do not stop taking except on your doctor's advice. Talk to your pediatrician regarding the use of this medicine in children. Special care may be needed. Overdosage: If you  think you have taken too much of this medicine contact a poison control center or emergency room at once. NOTE: This medicine is only for you. Do not share this medicine with others. What if I miss a dose? If you miss a dose, take it as soon as you can. If it is almost time for your next dose, take only that dose. Do not take double or extra doses. What may interact with this medicine? -alcohol -antiinflammatory drugs for pain or swelling -barbiturates for sleep or seizure control -digoxin -dofetilide -lithium -medicines for blood sugar -medicines for high blood pressure -medicines that relax muscles for surgery -methenamine -other diuretics -some medicines for pain -steroid hormones like cortisone, hydrocortisone, and prednisone -warfarin This list may not describe all possible interactions. Give your health care provider a list of all the medicines, herbs, non-prescription drugs, or dietary supplements you use. Also tell them if you smoke, drink alcohol, or use illegal drugs. Some items may interact with your medicine. What should I watch for while using this medicine? Visit your doctor or health care professional for regular checks on your progress. Check your blood pressure as directed. Ask your doctor or health care professional what your blood pressure should be and when you should contact him or her. You may need to be on a special diet while taking this medicine. Ask your doctor. Check with your doctor or health care professional if you get an attack of severe diarrhea, nausea and vomiting, or if you sweat a lot. The loss of too much body fluid can make it dangerous for you to take this medicine. You may get drowsy or dizzy. Do not drive, use machinery, or do anything that needs mental alertness until you know how this medicine affects you. Do not stand or sit up quickly, especially if you are an older patient. This reduces the risk of dizzy or fainting spells. Alcohol may interfere  with the effect of this medicine. Avoid alcoholic drinks. This medicine may affect your blood sugar level. If you have diabetes, check with your doctor or health care professional before changing the dose of your diabetic medicine. This medicine can make you more sensitive to the sun. Keep out of the sun. If you cannot avoid being in the sun, wear protective clothing and use sunscreen. Do not use sun lamps or tanning beds/booths. What side effects may I notice from receiving this medicine? Side effects that you should report to your doctor or health care professional as soon as possible: -allergic reactions such as skin rash or itching, hives, swelling of the lips, mouth, tongue, or throat -fast or irregular heartbeat, chest pain -feeling faint -fever, chills -gout pain -hot red lump on leg -muscle pain, cramps -nausea, vomiting -numbness or tingling in hands, feet -pain or difficulty when passing urine -redness, blistering, peeling or loosening of the skin, including inside the mouth -unusual bleeding or bruising -unusually weak or tired -  yellowing of the eyes, skin Side effects that usually do not require medical attention (report to your doctor or health care professional if they continue or are bothersome): -abdominal pain -blurred vision -constipation or diarrhea -dry mouth -headache This list may not describe all possible side effects. Call your doctor for medical advice about side effects. You may report side effects to FDA at 1-800-FDA-1088. Where should I keep my medicine? Keep out of the reach of children. Store at room temperature between 15 and 30 degrees C (59 and 86 degrees F). Protect from light. Keep container tightly closed. Throw away any unused medicine after the expiration date. NOTE: This sheet is a summary. It may not cover all possible information. If you have questions about this medicine, talk to your doctor, pharmacist, or health care provider.  2019  Elsevier/Gold Standard (2008-02-22 14:11:48)    Pantoprazole tablets What is this medicine? PANTOPRAZOLE (pan TOE pra zole) prevents the production of acid in the stomach. It is used to treat gastroesophageal reflux disease (GERD), inflammation of the esophagus, and Zollinger-Ellison syndrome. This medicine may be used for other purposes; ask your health care provider or pharmacist if you have questions. COMMON BRAND NAME(S): Protonix What should I tell my health care provider before I take this medicine? They need to know if you have any of these conditions: -liver disease -low levels of magnesium in the blood -lupus -an unusual or allergic reaction to omeprazole, lansoprazole, pantoprazole, rabeprazole, other medicines, foods, dyes, or preservatives -pregnant or trying to get pregnant -breast-feeding How should I use this medicine? Take this medicine by mouth. Swallow the tablets whole with a drink of water. Follow the directions on the prescription label. Do not crush, break, or chew. Take your medicine at regular intervals. Do not take your medicine more often than directed. Talk to your pediatrician regarding the use of this medicine in children. While this drug may be prescribed for children as young as 5 years for selected conditions, precautions do apply. Overdosage: If you think you have taken too much of this medicine contact a poison control center or emergency room at once. NOTE: This medicine is only for you. Do not share this medicine with others. What if I miss a dose? If you miss a dose, take it as soon as you can. If it is almost time for your next dose, take only that dose. Do not take double or extra doses. What may interact with this medicine? Do not take this medicine with any of the following medications: -atazanavir -nelfinavir This medicine may also interact with the following medications: -ampicillin -delavirdine -erlotinib -iron salts -medicines for fungal  infections like ketoconazole, itraconazole and voriconazole -methotrexate -mycophenolate mofetil -warfarin This list may not describe all possible interactions. Give your health care provider a list of all the medicines, herbs, non-prescription drugs, or dietary supplements you use. Also tell them if you smoke, drink alcohol, or use illegal drugs. Some items may interact with your medicine. What should I watch for while using this medicine? It can take several days before your stomach pain gets better. Check with your doctor or health care professional if your condition does not start to get better, or if it gets worse. You may need blood work done while you are taking this medicine. This medicine may cause a decrease in vitamin B12. You should make sure that you get enough vitamin B12 while you are taking this medicine. Discuss the foods you eat and the vitamins you take with your  health care professional. What side effects may I notice from receiving this medicine? Side effects that you should report to your doctor or health care professional as soon as possible: - allergic reactions like skin rash, itching or hives, swelling of the face, lips, or tongue - bone, muscle or joint pain - breathing problems - chest pain or chest tightness - dark yellow or brown urine - dizziness - fast, irregular heartbeat - feeling faint or lightheaded - fever or sore throat - muscle spasm - palpitations - rash on cheeks or arms that gets worse in the sun - redness, blistering, peeling or loosening of the skin, including inside the mouth - seizures -stomach polyps - tremors - unusual bleeding or bruising - unusually weak or tired - yellowing of the eyes or skin Side effects that usually do not require medical attention (report to your doctor or health care professional if they continue or are bothersome): - constipation - diarrhea - dry mouth - headache - nausea This list may  not describe all possible side effects. Call your doctor for medical advice about side effects. You may report side effects to FDA at 1-800-FDA-1088. Where should I keep my medicine? Keep out of the reach of children. Store at room temperature between 15 and 30 degrees C (59 and 86 degrees F). Protect from light and moisture. Throw away any unused medicine after the expiration date. NOTE: This sheet is a summary. It may not cover all possible information. If you have questions about this medicine, talk to your doctor, pharmacist, or health care provider.  2019 Elsevier/Gold Standard (2017-03-21 13:51:59)

## 2018-10-21 LAB — BASIC METABOLIC PANEL WITH GFR
BUN/Creatinine Ratio: 25 (ref 12–28)
BUN: 28 mg/dL — ABNORMAL HIGH (ref 8–27)
CO2: 24 mmol/L (ref 20–29)
Calcium: 9.4 mg/dL (ref 8.7–10.3)
Chloride: 96 mmol/L (ref 96–106)
Creatinine, Ser: 1.1 mg/dL — ABNORMAL HIGH (ref 0.57–1.00)
GFR calc Af Amer: 56 mL/min/1.73 — ABNORMAL LOW
GFR calc non Af Amer: 48 mL/min/1.73 — ABNORMAL LOW
Glucose: 85 mg/dL (ref 65–99)
Potassium: 4.5 mmol/L (ref 3.5–5.2)
Sodium: 140 mmol/L (ref 134–144)

## 2018-10-21 LAB — CBC
Hematocrit: 32.4 % — ABNORMAL LOW (ref 34.0–46.6)
Hemoglobin: 10.6 g/dL — ABNORMAL LOW (ref 11.1–15.9)
MCH: 27.4 pg (ref 26.6–33.0)
MCHC: 32.7 g/dL (ref 31.5–35.7)
MCV: 84 fL (ref 79–97)
Platelets: 211 10*3/uL (ref 150–450)
RBC: 3.87 x10E6/uL (ref 3.77–5.28)
RDW: 14.1 % (ref 11.7–15.4)
WBC: 6.5 10*3/uL (ref 3.4–10.8)

## 2018-10-21 LAB — PRO B NATRIURETIC PEPTIDE: NT-Pro BNP: 1133 pg/mL — ABNORMAL HIGH (ref 0–738)

## 2018-10-27 ENCOUNTER — Other Ambulatory Visit: Payer: Self-pay | Admitting: Cardiology

## 2018-10-29 ENCOUNTER — Telehealth: Payer: Self-pay | Admitting: Cardiology

## 2018-10-29 NOTE — Telephone Encounter (Signed)
See her PCP

## 2018-10-29 NOTE — Telephone Encounter (Signed)
Patient was seen in the office on 10/20/2018 and Dr. Bettina Gavia made several medication changes. Patient started taking new medications on the same day as her office visit and reports that she woke up vomiting on Sunday, 10/25/2018. Patient has been sick ever since and has not been able to keep anything on her stomach. She is concerned that her new medications might have caused this. Will have Dr. Bettina Gavia advise. Also, Patient wants to know what Dr. Bettina Gavia would recommend to help with nausea and vomiting.    Informed patient to contact her PCP regarding symptoms.

## 2018-10-29 NOTE — Telephone Encounter (Signed)
Patient called and states she has been vomiting every morning for 5 days. Just once first thing in the am and wants to know if her change of medicine could be causing it. Please advise.

## 2018-10-30 NOTE — Telephone Encounter (Signed)
Patient informed again to contact her PCP for further advisement. Patient verbalized understanding. No further questions.

## 2018-11-08 ENCOUNTER — Other Ambulatory Visit: Payer: Self-pay | Admitting: Cardiology

## 2018-11-09 ENCOUNTER — Telehealth: Payer: Self-pay

## 2018-11-09 NOTE — Telephone Encounter (Signed)
Called patient's daughter [name] to set up televist for 11-10-2018 with Dr Bettina Gavia.  [name] is very concerned with her mother's health at this time.  [name] states that her mother has not been eating or drinking in the past two weeks. She also reports her mother having shortness of breath with swelling in her abdomen.  She does not report any swelling in her feet, legs, or ankles.  [name] is concerned this could be congestive heart failure.  Spoke with Dr Bettina Gavia and he advised that the patient should follow up with primary care physician due to the fact that she has endometrial cancer and this could be the cause of the patients symptoms.  [name] made aware to contact Dr Laqueta Due for further evaluation.  [name] will contact our office for future appointments.  [name] agreed to plan and verbalized understanding.

## 2018-11-10 ENCOUNTER — Ambulatory Visit: Payer: PPO | Admitting: Cardiology

## 2018-11-10 ENCOUNTER — Encounter: Payer: Self-pay | Admitting: Cardiology

## 2018-11-10 DIAGNOSIS — R140 Abdominal distension (gaseous): Secondary | ICD-10-CM | POA: Diagnosis not present

## 2018-11-10 DIAGNOSIS — N132 Hydronephrosis with renal and ureteral calculous obstruction: Secondary | ICD-10-CM | POA: Diagnosis not present

## 2018-11-10 DIAGNOSIS — R1084 Generalized abdominal pain: Secondary | ICD-10-CM | POA: Diagnosis not present

## 2018-11-11 ENCOUNTER — Other Ambulatory Visit: Payer: Self-pay | Admitting: Cardiology

## 2018-11-12 ENCOUNTER — Encounter: Payer: Self-pay | Admitting: Cardiology

## 2018-11-12 DIAGNOSIS — Z7901 Long term (current) use of anticoagulants: Secondary | ICD-10-CM | POA: Diagnosis not present

## 2018-11-12 DIAGNOSIS — R978 Other abnormal tumor markers: Secondary | ICD-10-CM | POA: Diagnosis not present

## 2018-11-12 DIAGNOSIS — E860 Dehydration: Secondary | ICD-10-CM | POA: Diagnosis not present

## 2018-11-12 DIAGNOSIS — C55 Malignant neoplasm of uterus, part unspecified: Secondary | ICD-10-CM | POA: Diagnosis not present

## 2018-11-12 LAB — PROTIME-INR

## 2018-11-18 ENCOUNTER — Other Ambulatory Visit: Payer: Self-pay | Admitting: Cardiology

## 2018-11-23 ENCOUNTER — Other Ambulatory Visit: Payer: Self-pay | Admitting: Cardiology

## 2018-11-30 ENCOUNTER — Telehealth: Payer: PPO | Admitting: Cardiology

## 2018-12-08 ENCOUNTER — Other Ambulatory Visit: Payer: Self-pay | Admitting: Cardiology

## 2018-12-11 ENCOUNTER — Other Ambulatory Visit: Payer: Self-pay | Admitting: Cardiology

## 2018-12-11 NOTE — Telephone Encounter (Signed)
Rx refill sent to pharmacy. 

## 2018-12-18 DEATH — deceased

## 2019-07-21 IMAGING — DX DG ABDOMEN 2V
2 series · 2 of 2 positions shown · non-contrast
Comparison: None.

CLINICAL DATA: Incontinence of urine and feces since hysterectomy
in March 2018

EXAM:
ABDOMEN - 2 VIEW

[abdomen erect]
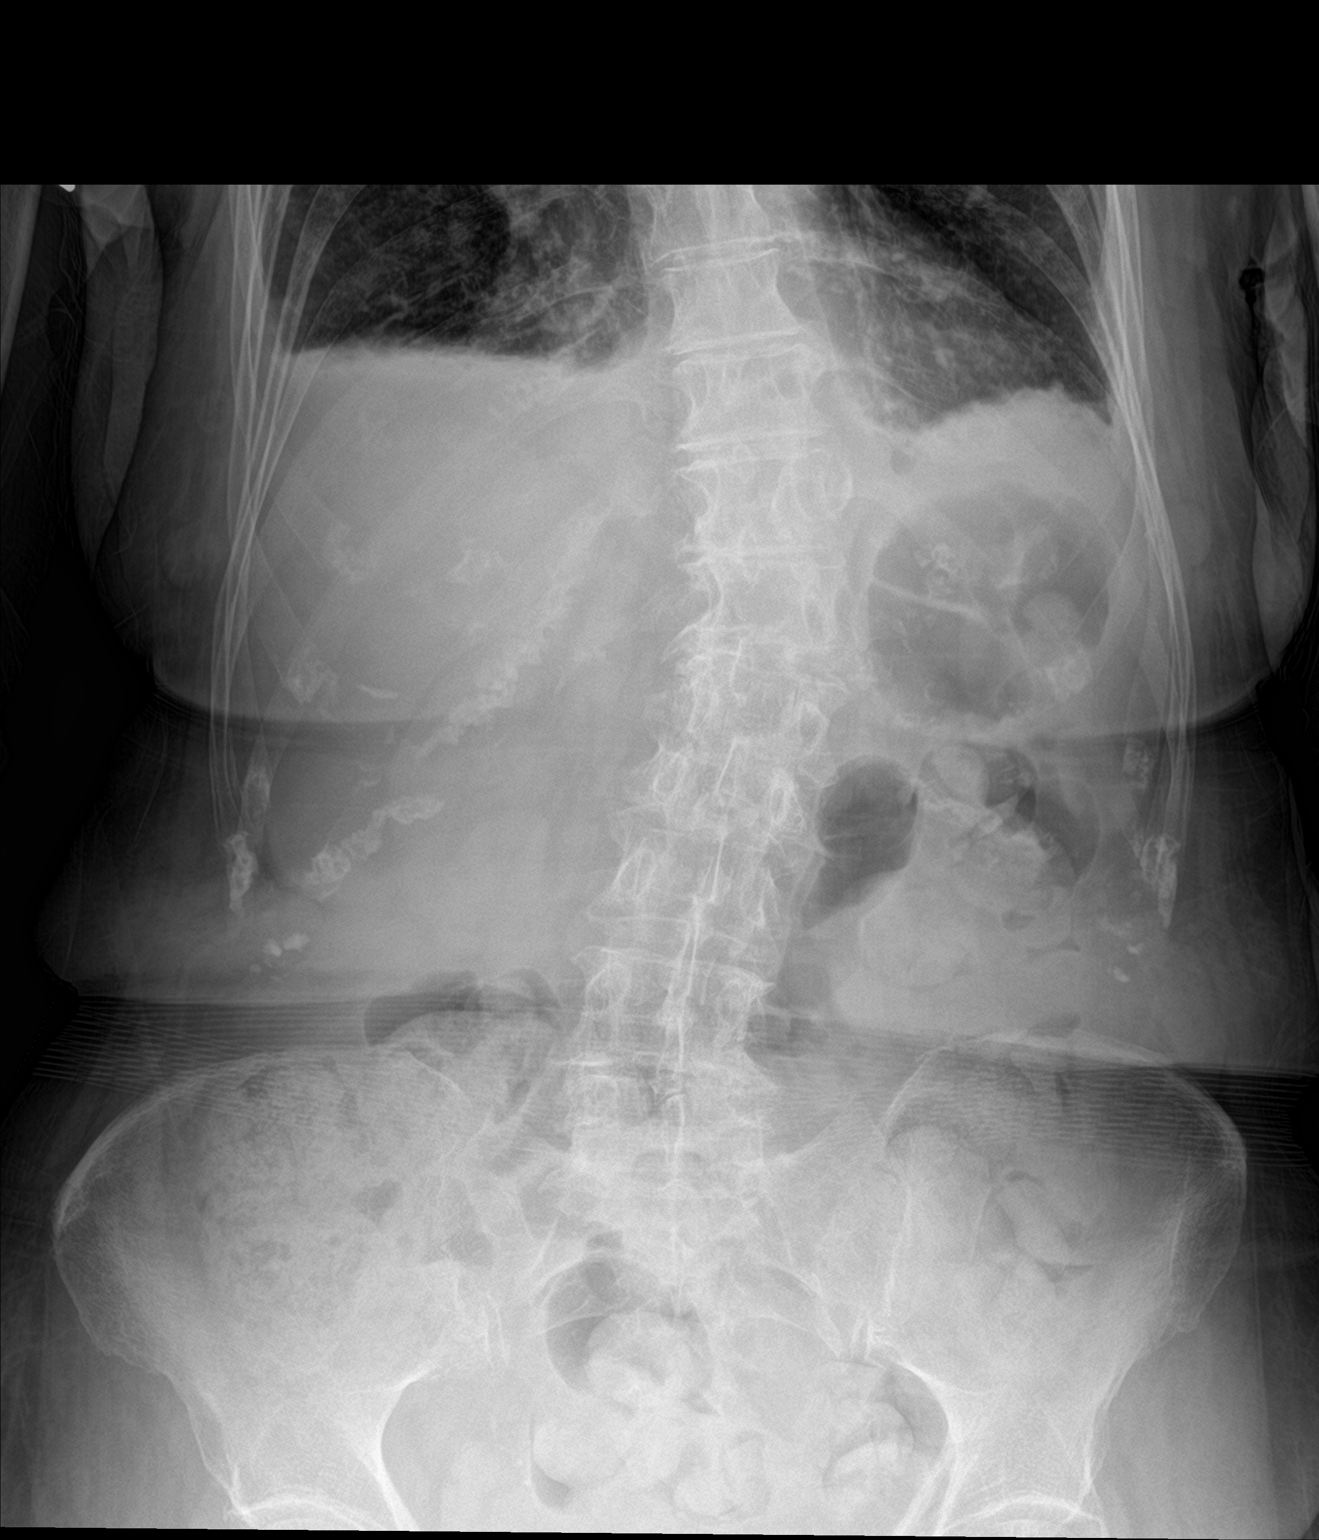

[abdomen supine]
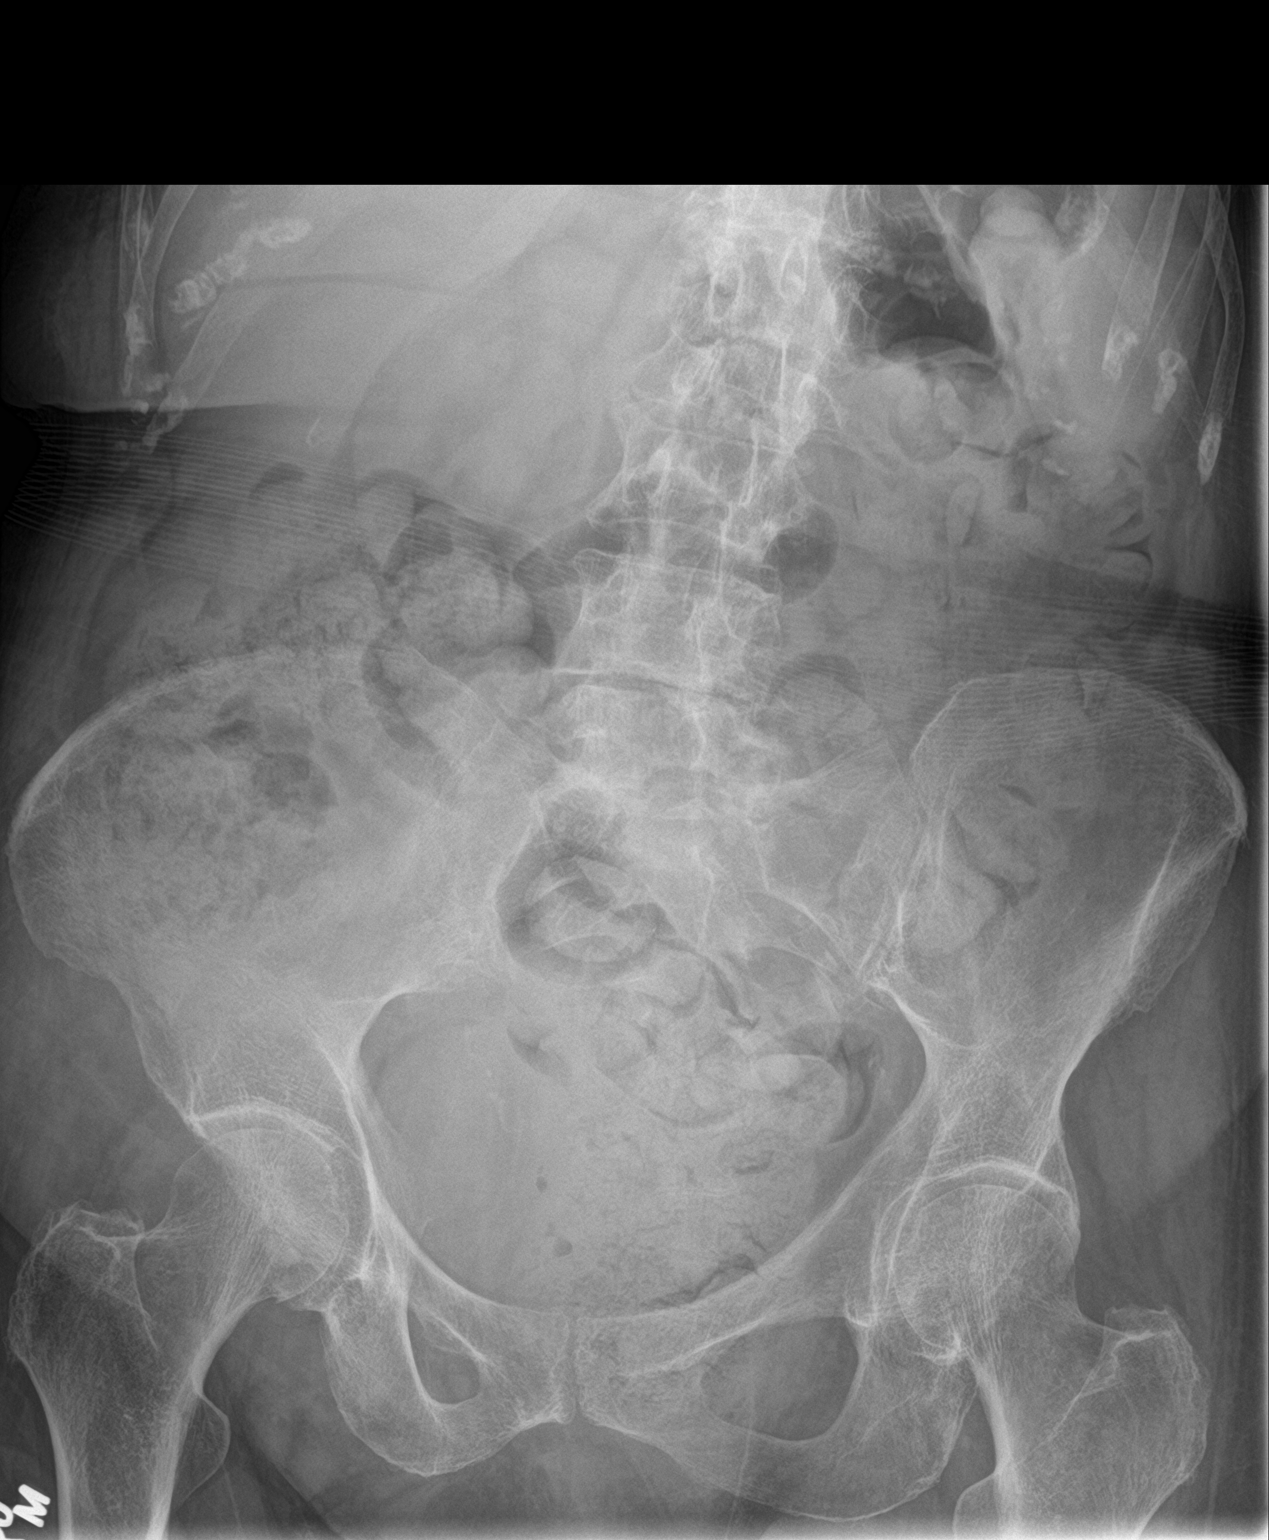

[2 of 2 positions shown; findings below may reference images not displayed]

FINDINGS: The colonic stool burden is increased. There's increased rectal
stool burden as well. There's no small or large bowel obstructive
pattern. There's moderate levocurvature of the thoracolumbar spine
centered at T12. There are no abnormal soft tissue calcifications.
IMPRESSION: Increased colonic and rectal stool burdens may reflect constipation
and/or fecal impaction in the appropriate clinical setting. No acute
intra-abdominal abnormality.
